# Patient Record
Sex: Female | Born: 1985 | Race: Black or African American | Hispanic: No | Marital: Single | Smoking: Never smoker
Health system: Southern US, Community
[De-identification: ages and names within clinical notes are randomized; demographics above are authoritative.]

## PROBLEM LIST (undated history)

## (undated) ENCOUNTER — Inpatient Hospital Stay (HOSPITAL_COMMUNITY): Payer: Self-pay

## (undated) DIAGNOSIS — Z6841 Body Mass Index (BMI) 40.0 and over, adult: Secondary | ICD-10-CM

## (undated) DIAGNOSIS — O24419 Gestational diabetes mellitus in pregnancy, unspecified control: Secondary | ICD-10-CM

## (undated) DIAGNOSIS — O139 Gestational [pregnancy-induced] hypertension without significant proteinuria, unspecified trimester: Secondary | ICD-10-CM

## (undated) DIAGNOSIS — B009 Herpesviral infection, unspecified: Secondary | ICD-10-CM

## (undated) HISTORY — PX: TONSILLECTOMY: SUR1361

## (undated) HISTORY — DX: Gestational (pregnancy-induced) hypertension without significant proteinuria, unspecified trimester: O13.9

---

## 2017-07-26 ENCOUNTER — Encounter: Payer: Self-pay | Admitting: Obstetrics and Gynecology

## 2017-07-26 ENCOUNTER — Ambulatory Visit (INDEPENDENT_AMBULATORY_CARE_PROVIDER_SITE_OTHER): Payer: BLUE CROSS/BLUE SHIELD | Admitting: Obstetrics and Gynecology

## 2017-07-26 VITALS — BP 141/74 | HR 53 | Ht 70.0 in | Wt 390.0 lb

## 2017-07-26 DIAGNOSIS — Z124 Encounter for screening for malignant neoplasm of cervix: Secondary | ICD-10-CM | POA: Diagnosis not present

## 2017-07-26 DIAGNOSIS — O0990 Supervision of high risk pregnancy, unspecified, unspecified trimester: Secondary | ICD-10-CM | POA: Insufficient documentation

## 2017-07-26 DIAGNOSIS — O99211 Obesity complicating pregnancy, first trimester: Secondary | ICD-10-CM

## 2017-07-26 DIAGNOSIS — Z113 Encounter for screening for infections with a predominantly sexual mode of transmission: Secondary | ICD-10-CM

## 2017-07-26 DIAGNOSIS — Z1151 Encounter for screening for human papillomavirus (HPV): Secondary | ICD-10-CM

## 2017-07-26 DIAGNOSIS — Z8759 Personal history of other complications of pregnancy, childbirth and the puerperium: Secondary | ICD-10-CM | POA: Insufficient documentation

## 2017-07-26 DIAGNOSIS — O0991 Supervision of high risk pregnancy, unspecified, first trimester: Secondary | ICD-10-CM

## 2017-07-26 DIAGNOSIS — Z6841 Body Mass Index (BMI) 40.0 and over, adult: Secondary | ICD-10-CM | POA: Insufficient documentation

## 2017-07-26 LAB — POCT URINALYSIS DIP (DEVICE)
BILIRUBIN URINE: NEGATIVE
GLUCOSE, UA: NEGATIVE mg/dL
KETONES UR: 40 mg/dL — AB
LEUKOCYTES UA: NEGATIVE
NITRITE: NEGATIVE
Protein, ur: NEGATIVE mg/dL
Specific Gravity, Urine: 1.025 (ref 1.005–1.030)
Urobilinogen, UA: 0.2 mg/dL (ref 0.0–1.0)
pH: 6.5 (ref 5.0–8.0)

## 2017-07-26 MED ORDER — PRENATAL VITAMINS 28-0.8 MG PO TABS: ORAL_TABLET | ORAL | 9 refills | Status: AC

## 2017-07-26 NOTE — Progress Notes (Signed)
Subjective:  Jamie IraniConstance Taylor is a 31 y.o. G2P1001 at 5512w3d being seen today for initial prenatal care.  She is currently monitored for the following issues for this high-risk pregnancy and has History of pre-eclampsia; Supervision of high risk pregnancy, antepartum; and Morbid obesity (HCC) on her problem list.   Patient reports pelvic cramping. She was seen in [place] and had US which showed intrauterine pregnancy per patient report. Cramping comes and goes. Had spotting in beginning but denies any now.  Contractions: Not present. Vag. Bleeding: None.  Movement: Absent. Denies leaking of fluid. Denies morning sickness. Taking PNV.  No h/o of cHTN per patient. Not on medications.   The following portions of the patient's history were reviewed and updated as appropriate: allergies, current medications, past family history, past medical history, past social history, past surgical history and problem list. Problem list updated.  Objective:  BP (!) 141/74   Pulse (!) 53   Ht 5\' 10"  (1.778 m)   Wt (!) 390 lb (176.9 kg)   BMI 55.96 kg/m   Fetal Status:     Movement: Absent     General:  Alert, oriented and cooperative. Patient is in no acute distress. Obese.  Skin: Skin is warm and dry. No rash noted.   Cardiovascular: Normal heart rate noted  Respiratory: Normal respiratory effort, no problems with respiration noted  Abdomen: Soft, protuberant, appropriate for gestational age. Pain/Pressure: Present     Pelvic: Vag. Bleeding: None   Genitalia:  Normal introitus for age, no external lesions, no vaginal discharge, mucosa pink and moist, no vaginal or cervical lesions, no vaginal atrophy, no friaility or hemorrhage, normal uterus size and position, no adnexal masses or tenderness  Extremities: Normal range of motion.  Edema: None  Mental Status: Normal mood and affect. Normal behavior. Normal judgment and thought content.   Urinalysis: Urine dipstick shows negative for all components,  positive for ketones.  Assessment and Plan:  Pregnancy: G2P1001 at 6412w3d  1. Supervision of high risk pregnancy, antepartum Continue routine prenatal care. Patient high-risk due to h/o pre-eclampsia in prior pregnancy and morbid obesity. Labs per orders. - Cytology - PAP - Obstetric Panel, Including HIV - Culture, OB Urine  2. History of pre-eclampsia BP elevated today x1. Will not diagnosis with cHTN at this time but most likely has this. Start ASA at 12wks. Will collect initial pre-E labs.  - Comprehensive metabolic panel - Protein / creatinine ratio, urine  3. Morbid obesity (HCC) No h/o diabetes. Collect A1c. Discussed appropriate weight gain in pregnancy. Letter written for patient to continue exercise during pregnancy.  - Hemoglobin A1c  Counseled on general obstetric precautions including but not limited to vaginal bleeding, cramping, etc.  Please refer to After Visit Summary for other counseling recommendations.  Return in about 4 weeks (around 08/23/2017) for ob visit.   Caryl AdaJazma Phelps, DO OB Fellow Faculty Practice, [facility] 07/26/2017, 8:56 AM

## 2017-07-26 NOTE — Progress Notes (Signed)
Pt reports that she had an ultrasound last week at the emergency room in [place]. Will send ROI to get records.

## 2017-07-26 NOTE — Patient Instructions (Addendum)
Will start ASA at 12 weeks Follow-up in 4 weeks  First Trimester of Pregnancy The first trimester of pregnancy is from week 1 until the end of week 13 (months 1 through 3). During this time, your baby will begin to develop inside you. At 6-8 weeks, the eyes and face are formed, and the heartbeat can be seen on ultrasound. At the end of 12 weeks, all the baby's organs are formed. Prenatal care is all the medical care you receive before the birth of your baby. Make sure you get good prenatal care and follow all of your doctor's instructions. Follow these instructions at home: Medicines  Take over-the-counter and prescription medicines only as told by your doctor. Some medicines are safe and some medicines are not safe during pregnancy.  Take a prenatal vitamin that contains at least 600 micrograms (mcg) of folic acid.  If you have trouble pooping (constipation), take medicine that will make your stool soft (stool softener) if your doctor approves. Eating and drinking  Eat regular, healthy meals.  Your doctor will tell you the amount of weight gain that is right for you.  Avoid raw meat and uncooked cheese.  If you feel sick to your stomach (nauseous) or throw up (vomit): ? Eat 4 or 5 small meals a day instead of 3 large meals. ? Try eating a few soda crackers. ? Drink liquids between meals instead of during meals.  To prevent constipation: ? Eat foods that are high in fiber, like fresh fruits and vegetables, whole grains, and beans. ? Drink enough fluids to keep your pee (urine) clear or pale yellow. Activity  Exercise only as told by your doctor. Stop exercising if you have cramps or pain in your lower belly (abdomen) or low back.  Do not exercise if it is too hot, too humid, or if you are in a place of great height (high altitude).  Try to avoid standing for long periods of time. Move your legs often if you must stand in one place for a long time.  Avoid heavy lifting.  Wear  low-heeled shoes. Sit and stand up straight.  You can have sex unless your doctor tells you not to. Relieving pain and discomfort  Wear a good support bra if your breasts are sore.  Take warm water baths (sitz baths) to soothe pain or discomfort caused by hemorrhoids. Use hemorrhoid cream if your doctor says it is okay.  Rest with your legs raised if you have leg cramps or low back pain.  If you have puffy, bulging veins (varicose veins) in your legs: ? Wear support hose or compression stockings as told by your doctor. ? Raise (elevate) your feet for 15 minutes, 3-4 times a day. ? Limit salt in your food. Prenatal care  Schedule your prenatal visits by the twelfth week of pregnancy.  Write down your questions. Take them to your prenatal visits.  Keep all your prenatal visits as told by your doctor. This is important. Safety  Wear your seat belt at all times when driving.  Make a list of emergency phone numbers. The list should include numbers for family, friends, the hospital, and police and fire departments. General instructions  Ask your doctor for a referral to a local prenatal class. Begin classes no later than at the start of month 6 of your pregnancy.  Ask for help if you need counseling or if you need help with nutrition. Your doctor can give you advice or tell you where to go  for help.  Do not use hot tubs, steam rooms, or saunas.  Do not douche or use tampons or scented sanitary pads.  Do not cross your legs for long periods of time.  Avoid all herbs and alcohol. Avoid drugs that are not approved by your doctor.  Do not use any tobacco products, including cigarettes, chewing tobacco, and electronic cigarettes. If you need help quitting, ask your doctor. You may get counseling or other support to help you quit.  Avoid cat litter boxes and soil used by cats. These carry germs that can cause birth defects in the baby and can cause a loss of your baby (miscarriage) or  stillbirth.  Visit your dentist. At home, brush your teeth with a soft toothbrush. Be gentle when you floss. Contact a doctor if:  You are dizzy.  You have mild cramps or pressure in your lower belly.  You have a nagging pain in your belly area.  You continue to feel sick to your stomach, you throw up, or you have watery poop (diarrhea).  You have a bad smelling fluid coming from your vagina.  You have pain when you pee (urinate).  You have increased puffiness (swelling) in your face, hands, legs, or ankles. Get help right away if:  You have a fever.  You are leaking fluid from your vagina.  You have spotting or bleeding from your vagina.  You have very bad belly cramping or pain.  You gain or lose weight rapidly.  You throw up blood. It may look like coffee grounds.  You are around people who have [place] measles, fifth disease, or chickenpox.  You have a very bad headache.  You have shortness of breath.  You have any kind of trauma, such as from a fall or a car accident. Summary  The first trimester of pregnancy is from week 1 until the end of week 13 (months 1 through 3).  To take care of yourself and your unborn baby, you will need to eat healthy meals, take medicines only if your doctor tells you to do so, and do activities that are safe for you and your baby.  Keep all follow-up visits as told by your doctor. This is important as your doctor will have to ensure that your baby is healthy and growing well. This information is not intended to replace advice given to you by your health care provider. Make sure you discuss any questions you have with your health care provider. Document Released: 05/30/2008 Document Revised: 12/20/2016 Document Reviewed: 12/20/2016 Elsevier Interactive Patient Education  2017 [employer]

## 2017-07-27 LAB — OBSTETRIC PANEL, INCLUDING HIV
Antibody Screen: NEGATIVE
BASOS ABS: 0 10*3/uL (ref 0.0–0.2)
Basos: 0 %
EOS (ABSOLUTE): 0 10*3/uL (ref 0.0–0.4)
Eos: 1 %
HEMATOCRIT: 36.1 % (ref 34.0–46.6)
HEP B S AG: NEGATIVE
HIV SCREEN 4TH GENERATION: NONREACTIVE
Hemoglobin: 12.5 g/dL (ref 11.1–15.9)
IMMATURE GRANS (ABS): 0 10*3/uL (ref 0.0–0.1)
Immature Granulocytes: 0 %
LYMPHS: 17 %
Lymphocytes Absolute: 1.5 10*3/uL (ref 0.7–3.1)
MCH: 30.3 pg (ref 26.6–33.0)
MCHC: 34.6 g/dL (ref 31.5–35.7)
MCV: 87 fL (ref 79–97)
MONOCYTES: 7 %
Monocytes Absolute: 0.6 10*3/uL (ref 0.1–0.9)
NEUTROS ABS: 6.5 10*3/uL (ref 1.4–7.0)
Neutrophils: 75 %
PLATELETS: 292 10*3/uL (ref 150–379)
RBC: 4.13 x10E6/uL (ref 3.77–5.28)
RDW: 14.5 % (ref 12.3–15.4)
RPR: NONREACTIVE
RUBELLA: 2.27 {index} (ref >0.99)
Rh Factor: POSITIVE
WBC: 8.6 10*3/uL (ref 3.4–10.8)

## 2017-07-27 LAB — COMPREHENSIVE METABOLIC PANEL
ALBUMIN: 3.7 g/dL (ref 3.5–5.5)
ALT: 12 IU/L (ref 0–32)
AST: 15 IU/L (ref 0–40)
Albumin/Globulin Ratio: 1.4 (ref 1.2–2.2)
Alkaline Phosphatase: 59 IU/L (ref 39–117)
BUN / CREAT RATIO: 15 (ref 9–23)
BUN: 10 mg/dL (ref 6–20)
Bilirubin Total: 0.7 mg/dL (ref 0.0–1.2)
CALCIUM: 8.9 mg/dL (ref 8.7–10.2)
CO2: 18 mmol/L — AB (ref 20–29)
CREATININE: 0.68 mg/dL (ref 0.57–1.00)
Chloride: 102 mmol/L (ref 96–106)
GFR, EST AFRICAN AMERICAN: 134 mL/min/{1.73_m2} (ref >59)
GFR, EST NON AFRICAN AMERICAN: 116 mL/min/{1.73_m2} (ref >59)
GLOBULIN, TOTAL: 2.7 g/dL (ref 1.5–4.5)
GLUCOSE: 88 mg/dL (ref 65–99)
Potassium: 4.1 mmol/L (ref 3.5–5.2)
SODIUM: 135 mmol/L (ref 134–144)
TOTAL PROTEIN: 6.4 g/dL (ref 6.0–8.5)

## 2017-07-27 LAB — PROTEIN / CREATININE RATIO, URINE
CREATININE, UR: 197.5 mg/dL
Protein, Ur: 10.5 mg/dL
Protein/Creat Ratio: 53 mg/g creat (ref 0–200)

## 2017-07-27 LAB — HEMOGLOBIN A1C
ESTIMATED AVERAGE GLUCOSE: 111 mg/dL
HEMOGLOBIN A1C: 5.5 % (ref 4.8–5.6)

## 2017-07-28 LAB — CYTOLOGY - PAP
Chlamydia: NEGATIVE
Diagnosis: NEGATIVE
HPV (WINDOPATH): NOT DETECTED
NEISSERIA GONORRHEA: NEGATIVE

## 2017-07-28 LAB — URINE CULTURE, OB REFLEX

## 2017-07-28 LAB — CULTURE, OB URINE

## 2017-08-01 ENCOUNTER — Ambulatory Visit: Payer: Self-pay

## 2017-08-02 ENCOUNTER — Encounter: Payer: Self-pay | Admitting: Obstetrics and Gynecology

## 2017-08-04 ENCOUNTER — Encounter: Payer: Self-pay | Admitting: Obstetrics and Gynecology

## 2017-08-10 ENCOUNTER — Encounter: Payer: Self-pay | Admitting: Obstetrics and Gynecology

## 2017-08-21 ENCOUNTER — Inpatient Hospital Stay (HOSPITAL_COMMUNITY): Payer: BLUE CROSS/BLUE SHIELD

## 2017-08-21 ENCOUNTER — Inpatient Hospital Stay (HOSPITAL_COMMUNITY)
Admission: AD | Admit: 2017-08-21 | Discharge: 2017-08-21 | Disposition: A | Discharge status: Home / Self Care | Payer: BLUE CROSS/BLUE SHIELD | Source: Ambulatory Visit | Attending: Obstetrics & Gynecology | Admitting: Obstetrics & Gynecology

## 2017-08-21 ENCOUNTER — Encounter (HOSPITAL_COMMUNITY): Payer: Self-pay

## 2017-08-21 DIAGNOSIS — O0992 Supervision of high risk pregnancy, unspecified, second trimester: Secondary | ICD-10-CM | POA: Diagnosis not present

## 2017-08-21 DIAGNOSIS — O0990 Supervision of high risk pregnancy, unspecified, unspecified trimester: Secondary | ICD-10-CM

## 2017-08-21 DIAGNOSIS — Z3A22 22 weeks gestation of pregnancy: Secondary | ICD-10-CM | POA: Diagnosis not present

## 2017-08-21 DIAGNOSIS — O23592 Infection of other part of genital tract in pregnancy, second trimester: Secondary | ICD-10-CM | POA: Insufficient documentation

## 2017-08-21 DIAGNOSIS — Z8759 Personal history of other complications of pregnancy, childbirth and the puerperium: Secondary | ICD-10-CM

## 2017-08-21 DIAGNOSIS — O208 Other hemorrhage in early pregnancy: Secondary | ICD-10-CM | POA: Diagnosis not present

## 2017-08-21 DIAGNOSIS — N760 Acute vaginitis: Secondary | ICD-10-CM

## 2017-08-21 DIAGNOSIS — O0993 Supervision of high risk pregnancy, unspecified, third trimester: Secondary | ICD-10-CM

## 2017-08-21 DIAGNOSIS — B9689 Other specified bacterial agents as the cause of diseases classified elsewhere: Secondary | ICD-10-CM

## 2017-08-21 DIAGNOSIS — O209 Hemorrhage in early pregnancy, unspecified: Secondary | ICD-10-CM

## 2017-08-21 DIAGNOSIS — O26892 Other specified pregnancy related conditions, second trimester: Secondary | ICD-10-CM | POA: Diagnosis present

## 2017-08-21 DIAGNOSIS — R109 Unspecified abdominal pain: Secondary | ICD-10-CM | POA: Diagnosis present

## 2017-08-21 LAB — WET PREP, GENITAL
SPERM: NONE SEEN
Trich, Wet Prep: NONE SEEN
YEAST WET PREP: NONE SEEN

## 2017-08-21 LAB — URINALYSIS, ROUTINE W REFLEX MICROSCOPIC
BILIRUBIN URINE: NEGATIVE
GLUCOSE, UA: NEGATIVE mg/dL
Ketones, ur: NEGATIVE mg/dL
LEUKOCYTES UA: NEGATIVE
NITRITE: NEGATIVE
Protein, ur: NEGATIVE mg/dL
SPECIFIC GRAVITY, URINE: 1.017 (ref 1.005–1.030)
pH: 6 (ref 5.0–8.0)

## 2017-08-21 MED ORDER — METRONIDAZOLE 500 MG PO TABS: 500.0000 mg | ORAL_TABLET | Freq: Three times a day (TID) | ORAL | 0 refills | Status: DC

## 2017-08-21 NOTE — Discharge Instructions (Signed)

## 2017-08-21 NOTE — MAU Provider Note (Signed)
Jamie Taylor 31 y.o. G2P1001 Patient complains of vaginal bleeding with abdominal pain beginning last night at 1900. History     CSN: [id number]  Arrival date and time: 08/21/17 4098   First Provider Initiated Contact with Patient 08/21/17 [phone]      Chief Complaint  Patient presents with  . Vaginal Bleeding  . Abdominal Pain   Vaginal Bleeding  The patient's primary symptoms include vaginal bleeding. This is a new problem. The current episode started yesterday. The problem occurs intermittently. The problem has been gradually worsening. The pain is moderate. She is pregnant. Associated symptoms include abdominal pain. Pertinent negatives include no headaches, nausea or vomiting. The vaginal discharge was bloody. The vaginal bleeding is spotting. She has not been passing clots. She is sexually active.  Abdominal Pain  This is a new problem. The current episode started yesterday. The onset quality is sudden. The problem occurs constantly. The problem has been waxing and waning. The pain is located in the periumbilical region. The pain is severe. The quality of the pain is cramping. Pertinent negatives include no headaches, nausea or vomiting.  The patient states that the vaginal bleeding was initially dark brown in color but has since been bright red. She also noted a larger amount this AM than there was last night when it began. The patient last had sexual intercourse yesterday morning. Patient first noticed the bleeding on her pantyliner at 1900 when she went to the bathroom. Then she started monitoring it over night and saw a few more spots and clots.  OB History    Gravida Para Term Preterm AB Living   2 1 1     1    SAB TAB Ectopic Multiple Live Births                  Past Medical History:  Diagnosis Date  . Pregnancy induced hypertension     Past Surgical History:  Procedure Laterality Date  . TONSILLECTOMY      History reviewed. No pertinent family history.  Social  History  Substance Use Topics  . Smoking status: Never Smoker  . Smokeless tobacco: Never Used  . Alcohol use No    Allergies: No Known Allergies  Prescriptions Prior to Admission  Medication Sig Dispense Refill Last Dose  . Prenatal Vit-Fe Fumarate-FA (PRENATAL VITAMINS) 28-0.8 MG TABS Take one tablet daily. 30 tablet 9     Review of Systems  Constitutional: Negative for fatigue.  Eyes: Negative for visual disturbance.  Respiratory: Negative for shortness of breath.   Cardiovascular: Negative for chest pain.  Gastrointestinal: Positive for abdominal pain. Negative for nausea and vomiting.  Genitourinary: Positive for vaginal bleeding.  Neurological: Negative for dizziness and headaches.   Physical Exam   Blood pressure (!) 142/50, pulse 71, temperature 98 F (36.7 C), temperature source Oral, resp. rate 18.  Physical Exam  Constitutional: She appears well-developed and well-nourished.  Cardiovascular: Normal rate and regular rhythm.   Respiratory: Effort normal. No respiratory distress.  Genitourinary: Cervix exhibits discharge. There is bleeding in the vagina. Vaginal discharge found.  Genitourinary Comments: NEFG; trace amount of blood in the vagina. No CMT, adnexal or suprapubic tenderness.     MAU Course  Procedures  MDM KOH wet prep Ultasound: SIUP with cardiac activity.  Advised patient that she may be feeling some early discomforts of pregnancy and that some women spot throughout their pregnancy and we don't know why.   Assessment and Plan  Assessment: 1. KOH prep shows clue  cells and WBCs 2. US shows SIUP with cardiac activity at 176 bpm  Plan: 1. Flagyl 500mg  TID x7 days 2. Follow up appointment as scheduled 3. If symptoms continue or worsen then return to MAU. Anna Genre 08/21/2017, 9:52 AM   I confirm that I have verified the information documented in the student's note and that I have also personally performed the physical exam and all medical  decision making activities.    1. Bacterial vaginosis   2. History of pre-eclampsia   3. Supervision of high risk pregnancy, antepartum   4. Vaginal bleeding affecting early pregnancy   5. Vaginal bleeding before [redacted] weeks gestation    2. Patient stable for discharge with RX for Flagyl and recommendations to keep prenatal visit.  3. Reviewed bleeding precautions and when to return to the MAU. Patient verbalized understanding.   Luna Kitchens CNM

## 2017-08-21 NOTE — MAU Note (Signed)
Pt has been having cramping and spotting. The spotting is getting a little worse today.

## 2017-08-22 LAB — GC/CHLAMYDIA PROBE AMP (~~LOC~~) NOT AT ARMC
Chlamydia: NEGATIVE
NEISSERIA GONORRHEA: NEGATIVE

## 2017-08-24 ENCOUNTER — Ambulatory Visit (INDEPENDENT_AMBULATORY_CARE_PROVIDER_SITE_OTHER): Payer: BLUE CROSS/BLUE SHIELD | Admitting: Family Medicine

## 2017-08-24 VITALS — BP 138/50 | HR 70 | Wt 393.5 lb

## 2017-08-24 DIAGNOSIS — O09292 Supervision of pregnancy with other poor reproductive or obstetric history, second trimester: Secondary | ICD-10-CM

## 2017-08-24 DIAGNOSIS — O0990 Supervision of high risk pregnancy, unspecified, unspecified trimester: Secondary | ICD-10-CM

## 2017-08-24 DIAGNOSIS — O0992 Supervision of high risk pregnancy, unspecified, second trimester: Secondary | ICD-10-CM

## 2017-08-24 DIAGNOSIS — O219 Vomiting of pregnancy, unspecified: Secondary | ICD-10-CM

## 2017-08-24 DIAGNOSIS — A6000 Herpesviral infection of urogenital system, unspecified: Secondary | ICD-10-CM | POA: Insufficient documentation

## 2017-08-24 DIAGNOSIS — A6004 Herpesviral vulvovaginitis: Secondary | ICD-10-CM

## 2017-08-24 DIAGNOSIS — O09299 Supervision of pregnancy with other poor reproductive or obstetric history, unspecified trimester: Secondary | ICD-10-CM

## 2017-08-24 DIAGNOSIS — Z8759 Personal history of other complications of pregnancy, childbirth and the puerperium: Secondary | ICD-10-CM

## 2017-08-24 MED ORDER — ASPIRIN EC 81 MG PO TBEC: 81.0000 mg | DELAYED_RELEASE_TABLET | Freq: Every day | ORAL | 3 refills | Status: DC

## 2017-08-24 MED ORDER — DOXYLAMINE-PYRIDOXINE 10-10 MG PO TBEC: 1.0000 | DELAYED_RELEASE_TABLET | Freq: Four times a day (QID) | ORAL | 2 refills | Status: DC

## 2017-08-24 NOTE — Patient Instructions (Signed)
 Second Trimester of Pregnancy The second trimester is from week 14 through week 27 (months 4 through 6). The second trimester is often a time when you feel your best. Your body has adjusted to being pregnant, and you begin to feel better physically. Usually, morning sickness has lessened or quit completely, you may have more energy, and you may have an increase in appetite. The second trimester is also a time when the fetus is growing rapidly. At the end of the sixth month, the fetus is about 9 inches long and weighs about 1 pounds. You will likely begin to feel the baby move (quickening) between 16 and 20 weeks of pregnancy. Body changes during your second trimester Your body continues to go through many changes during your second trimester. The changes vary from woman to woman.  Your weight will continue to increase. You will notice your lower abdomen bulging out.  You may begin to get stretch marks on your hips, abdomen, and breasts.  You may develop headaches that can be relieved by medicines. The medicines should be approved by your health care provider.  You may urinate more often because the fetus is pressing on your bladder.  You may develop or continue to have heartburn as a result of your pregnancy.  You may develop constipation because certain hormones are causing the muscles that push waste through your intestines to slow down.  You may develop hemorrhoids or swollen, bulging veins (varicose veins).  You may have back pain. This is caused by: ? Weight gain. ? Pregnancy hormones that are relaxing the joints in your pelvis. ? A shift in weight and the muscles that support your balance.  Your breasts will continue to grow and they will continue to become tender.  Your gums may bleed and may be sensitive to brushing and flossing.  Dark spots or blotches (chloasma, mask of pregnancy) may develop on your face. This will likely fade after the baby is born.  A dark line from  your belly button to the pubic area (linea nigra) may appear. This will likely fade after the baby is born.  You may have changes in your hair. These can include thickening of your hair, rapid growth, and changes in texture. Some women also have hair loss during or after pregnancy, or hair that feels dry or thin. Your hair will most likely return to normal after your baby is born.  What to expect at prenatal visits During a routine prenatal visit:  You will be weighed to make sure you and the fetus are growing normally.  Your blood pressure will be taken.  Your abdomen will be measured to track your baby's growth.  The fetal heartbeat will be listened to.  Any test results from the previous visit will be discussed.  Your health care provider may ask you:  How you are feeling.  If you are feeling the baby move.  If you have had any abnormal symptoms, such as leaking fluid, bleeding, severe headaches, or abdominal cramping.  If you are using any tobacco products, including cigarettes, chewing tobacco, and electronic cigarettes.  If you have any questions.  Other tests that may be performed during your second trimester include:  Blood tests that check for: ? Low iron levels (anemia). ? High blood sugar that affects pregnant women (gestational diabetes) between 24 and 28 weeks. ? Rh antibodies. This is to check for a protein on red blood cells (Rh factor).  Urine tests to check for infections, diabetes,   or protein in the urine.  An ultrasound to confirm the proper growth and development of the baby.  An amniocentesis to check for possible genetic problems.  Fetal screens for spina bifida and Down syndrome.  HIV (human immunodeficiency virus) testing. Routine prenatal testing includes screening for HIV, unless you choose not to have this test.  Follow these instructions at home: Medicines  Follow your health care provider's instructions regarding medicine use. Specific  medicines may be either safe or unsafe to take during pregnancy.  Take a prenatal vitamin that contains at least 600 micrograms (mcg) of folic acid.  If you develop constipation, try taking a stool softener if your health care provider approves. Eating and drinking  Eat a balanced diet that includes fresh fruits and vegetables, whole grains, good sources of protein such as meat, eggs, or tofu, and low-fat dairy. Your health care provider will help you determine the amount of weight gain that is right for you.  Avoid raw meat and uncooked cheese. These carry germs that can cause birth defects in the baby.  If you have low calcium intake from food, talk to your health care provider about whether you should take a daily calcium supplement.  Limit foods that are high in fat and processed sugars, such as fried and sweet foods.  To prevent constipation: ? Drink enough fluid to keep your urine clear or pale yellow. ? Eat foods that are high in fiber, such as fresh fruits and vegetables, whole grains, and beans. Activity  Exercise only as directed by your health care provider. Most women can continue their usual exercise routine during pregnancy. Try to exercise for 30 minutes at least 5 days a week. Stop exercising if you experience uterine contractions.  Avoid heavy lifting, wear low heel shoes, and practice good posture.  A sexual relationship may be continued unless your health care provider directs you otherwise. Relieving pain and discomfort  Wear a good support bra to prevent discomfort from breast tenderness.  Take warm sitz baths to soothe any pain or discomfort caused by hemorrhoids. Use hemorrhoid cream if your health care provider approves.  Rest with your legs elevated if you have leg cramps or low back pain.  If you develop varicose veins, wear support hose. Elevate your feet for 15 minutes, 3-4 times a day. Limit salt in your diet. Prenatal Care  Write down your questions.  Take them to your prenatal visits.  Keep all your prenatal visits as told by your health care provider. This is important. Safety  Wear your seat belt at all times when driving.  Make a list of emergency phone numbers, including numbers for family, friends, the hospital, and police and fire departments. General instructions  Ask your health care provider for a referral to a local prenatal education class. Begin classes no later than the beginning of month 6 of your pregnancy.  Ask for help if you have counseling or nutritional needs during pregnancy. Your health care provider can offer advice or refer you to specialists for help with various needs.  Do not use hot tubs, steam rooms, or saunas.  Do not douche or use tampons or scented sanitary pads.  Do not cross your legs for long periods of time.  Avoid cat litter boxes and soil used by cats. These carry germs that can cause birth defects in the baby and possibly loss of the fetus by miscarriage or stillbirth.  Avoid all smoking, herbs, alcohol, and unprescribed drugs. Chemicals in these products   can affect the formation and growth of the baby.  Do not use any products that contain nicotine or tobacco, such as cigarettes and e-cigarettes. If you need help quitting, ask your health care provider.  Visit your dentist if you have not gone yet during your pregnancy. Use a soft toothbrush to brush your teeth and be gentle when you floss. Contact a health care provider if:  You have dizziness.  You have mild pelvic cramps, pelvic pressure, or nagging pain in the abdominal area.  You have persistent nausea, vomiting, or diarrhea.  You have a bad smelling vaginal discharge.  You have pain when you urinate. Get help right away if:  You have a fever.  You are leaking fluid from your vagina.  You have spotting or bleeding from your vagina.  You have severe abdominal cramping or pain.  You have rapid weight gain or weight  loss.  You have shortness of breath with chest pain.  You notice sudden or extreme swelling of your face, hands, ankles, feet, or legs.  You have not felt your baby move in over an hour.  You have severe headaches that do not go away when you take medicine.  You have vision changes. Summary  The second trimester is from week 14 through week 27 (months 4 through 6). It is also a time when the fetus is growing rapidly.  Your body goes through many changes during pregnancy. The changes vary from woman to woman.  Avoid all smoking, herbs, alcohol, and unprescribed drugs. These chemicals affect the formation and growth your baby.  Do not use any tobacco products, such as cigarettes, chewing tobacco, and e-cigarettes. If you need help quitting, ask your health care provider.  Contact your health care provider if you have any questions. Keep all prenatal visits as told by your health care provider. This is important. This information is not intended to replace advice given to you by your health care provider. Make sure you discuss any questions you have with your health care provider. Document Released: 12/06/2001 Document Revised: 05/19/2016 Document Reviewed: 02/12/2013 [employer] Interactive Patient Education  2017 [employer]   Breastfeeding Deciding to breastfeed is one of the best choices you can make for you and your baby. A change in hormones during pregnancy causes your breast tissue to grow and increases the number and size of your milk ducts. These hormones also allow proteins, sugars, and fats from your blood supply to make breast milk in your milk-producing glands. Hormones prevent breast milk from being released before your baby is born as well as prompt milk flow after birth. Once breastfeeding has begun, thoughts of your baby, as well as his or her sucking or crying, can stimulate the release of milk from your milk-producing glands. Benefits of breastfeeding For Your  Baby  Your first milk (colostrum) helps your baby's digestive system function better.  There are antibodies in your milk that help your baby fight off infections.  Your baby has a lower incidence of asthma, allergies, and sudden infant death syndrome.  The nutrients in breast milk are better for your baby than infant formulas and are designed uniquely for your baby's needs.  Breast milk improves your baby's brain development.  Your baby is less likely to develop other conditions, such as childhood obesity, asthma, or type 2 diabetes mellitus.  For You  Breastfeeding helps to create a very special bond between you and your baby.  Breastfeeding is convenient. Breast milk is always available at   the correct temperature and costs nothing.  Breastfeeding helps to burn calories and helps you lose the weight gained during pregnancy.  Breastfeeding makes your uterus contract to its prepregnancy size faster and slows bleeding (lochia) after you give birth.  Breastfeeding helps to lower your risk of developing type 2 diabetes mellitus, osteoporosis, and breast or ovarian cancer later in life.  Signs that your baby is hungry Early Signs of Hunger  Increased alertness or activity.  Stretching.  Movement of the head from side to side.  Movement of the head and opening of the mouth when the corner of the mouth or cheek is stroked (rooting).  Increased sucking sounds, smacking lips, cooing, sighing, or squeaking.  Hand-to-mouth movements.  Increased sucking of fingers or hands.  Late Signs of Hunger  Fussing.  Intermittent crying.  Extreme Signs of Hunger Signs of extreme hunger will require calming and consoling before your baby will be able to breastfeed successfully. Do not wait for the following signs of extreme hunger to occur before you initiate breastfeeding:  Restlessness.  A loud, strong cry.  Screaming.  Breastfeeding basics Breastfeeding Initiation  Find a  comfortable place to sit or lie down, with your neck and back well supported.  Place a pillow or rolled up blanket under your baby to bring him or her to the level of your breast (if you are seated). Nursing pillows are specially designed to help support your arms and your baby while you breastfeed.  Make sure that your baby's abdomen is facing your abdomen.  Gently massage your breast. With your fingertips, massage from your chest wall toward your nipple in a circular motion. This encourages milk flow. You may need to continue this action during the feeding if your milk flows slowly.  Support your breast with 4 fingers underneath and your thumb above your nipple. Make sure your fingers are well away from your nipple and your baby's mouth.  Stroke your baby's lips gently with your finger or nipple.  When your baby's mouth is open wide enough, quickly bring your baby to your breast, placing your entire nipple and as much of the colored area around your nipple (areola) as possible into your baby's mouth. ? More areola should be visible above your baby's upper lip than below the lower lip. ? Your baby's tongue should be between his or her lower gum and your breast.  Ensure that your baby's mouth is correctly positioned around your nipple (latched). Your baby's lips should create a seal on your breast and be turned out (everted).  It is common for your baby to suck about 2-3 minutes in order to start the flow of breast milk.  Latching Teaching your baby how to latch on to your breast properly is very important. An improper latch can cause nipple pain and decreased milk supply for you and poor weight gain in your baby. Also, if your baby is not latched onto your nipple properly, he or she may swallow some air during feeding. This can make your baby fussy. Burping your baby when you switch breasts during the feeding can help to get rid of the air. However, teaching your baby to latch on properly is  still the best way to prevent fussiness from swallowing air while breastfeeding. Signs that your baby has successfully latched on to your nipple:  Silent tugging or silent sucking, without causing you pain.  Swallowing heard between every 3-4 sucks.  Muscle movement above and in front of his or her   ears while sucking.  Signs that your baby has not successfully latched on to nipple:  Sucking sounds or smacking sounds from your baby while breastfeeding.  Nipple pain.  If you think your baby has not latched on correctly, slip your finger into the corner of your baby's mouth to break the suction and place it between your baby's gums. Attempt breastfeeding initiation again. Signs of Successful Breastfeeding Signs from your baby:  A gradual decrease in the number of sucks or complete cessation of sucking.  Falling asleep.  Relaxation of his or her body.  Retention of a small amount of milk in his or her mouth.  Letting go of your breast by himself or herself.  Signs from you:  Breasts that have increased in firmness, weight, and size 1-3 hours after feeding.  Breasts that are softer immediately after breastfeeding.  Increased milk volume, as well as a change in milk consistency and color by the fifth day of breastfeeding.  Nipples that are not sore, cracked, or bleeding.  Signs That Your Baby is Getting Enough Milk  Wetting at least 1-2 diapers during the first 24 hours after birth.  Wetting at least 5-6 diapers every 24 hours for the first week after birth. The urine should be clear or pale yellow by 5 days after birth.  Wetting 6-8 diapers every 24 hours as your baby continues to grow and develop.  At least 3 stools in a 24-hour period by age 5 days. The stool should be soft and yellow.  At least 3 stools in a 24-hour period by age 7 days. The stool should be seedy and yellow.  No loss of weight greater than 10% of birth weight during the first 3 days of age.  Average  weight gain of 4-7 ounces (113-198 g) per week after age 4 days.  Consistent daily weight gain by age 5 days, without weight loss after the age of 2 weeks.  After a feeding, your baby may spit up a small amount. This is common. Breastfeeding frequency and duration Frequent feeding will help you make more milk and can prevent sore nipples and breast engorgement. Breastfeed when you feel the need to reduce the fullness of your breasts or when your baby shows signs of hunger. This is called "breastfeeding on demand." Avoid introducing a pacifier to your baby while you are working to establish breastfeeding (the first 4-6 weeks after your baby is born). After this time you may choose to use a pacifier. Research has shown that pacifier use during the first year of a baby's life decreases the risk of sudden infant death syndrome (SIDS). Allow your baby to feed on each breast as long as he or she wants. Breastfeed until your baby is finished feeding. When your baby unlatches or falls asleep while feeding from the first breast, offer the second breast. Because newborns are often sleepy in the first few weeks of life, you may need to awaken your baby to get him or her to feed. Breastfeeding times will vary from baby to baby. However, the following rules can serve as a guide to help you ensure that your baby is properly fed:  Newborns (babies 4 weeks of age or younger) may breastfeed every 1-3 hours.  Newborns should not go longer than 3 hours during the day or 5 hours during the night without breastfeeding.  You should breastfeed your baby a minimum of 8 times in a 24-hour period until you begin to introduce solid foods to your   baby at around 6 months of age.  Breast milk pumping Pumping and storing breast milk allows you to ensure that your baby is exclusively fed your breast milk, even at times when you are unable to breastfeed. This is especially important if you are going back to work while you are still  breastfeeding or when you are not able to be present during feedings. Your lactation consultant can give you guidelines on how long it is safe to store breast milk. A breast pump is a machine that allows you to pump milk from your breast into a sterile bottle. The pumped breast milk can then be stored in a refrigerator or freezer. Some breast pumps are operated by hand, while others use electricity. Ask your lactation consultant which type will work best for you. Breast pumps can be purchased, but some hospitals and breastfeeding support groups lease breast pumps on a monthly basis. A lactation consultant can teach you how to hand express breast milk, if you prefer not to use a pump. Caring for your breasts while you breastfeed Nipples can become dry, cracked, and sore while breastfeeding. The following recommendations can help keep your breasts moisturized and healthy:  Avoid using soap on your nipples.  Wear a supportive bra. Although not required, special nursing bras and tank tops are designed to allow access to your breasts for breastfeeding without taking off your entire bra or top. Avoid wearing underwire-style bras or extremely tight bras.  Air dry your nipples for 3-4minutes after each feeding.  Use only cotton bra pads to absorb leaked breast milk. Leaking of breast milk between feedings is normal.  Use lanolin on your nipples after breastfeeding. Lanolin helps to maintain your skin's normal moisture barrier. If you use pure lanolin, you do not need to wash it off before feeding your baby again. Pure lanolin is not toxic to your baby. You may also hand express a few drops of breast milk and gently massage that milk into your nipples and allow the milk to air dry.  In the first few weeks after giving birth, some women experience extremely full breasts (engorgement). Engorgement can make your breasts feel heavy, warm, and tender to the touch. Engorgement peaks within 3-5 days after you give  birth. The following recommendations can help ease engorgement:  Completely empty your breasts while breastfeeding or pumping. You may want to start by applying warm, moist heat (in the shower or with warm water-soaked hand towels) just before feeding or pumping. This increases circulation and helps the milk flow. If your baby does not completely empty your breasts while breastfeeding, pump any extra milk after he or she is finished.  Wear a snug bra (nursing or regular) or tank top for 1-2 days to signal your body to slightly decrease milk production.  Apply ice packs to your breasts, unless this is too uncomfortable for you.  Make sure that your baby is latched on and positioned properly while breastfeeding.  If engorgement persists after 48 hours of following these recommendations, contact your health care provider or a lactation consultant. Overall health care recommendations while breastfeeding  Eat healthy foods. Alternate between meals and snacks, eating 3 of each per day. Because what you eat affects your breast milk, some of the foods may make your baby more irritable than usual. Avoid eating these foods if you are sure that they are negatively affecting your baby.  Drink milk, fruit juice, and water to satisfy your thirst (about 10 glasses a day).    Rest often, relax, and continue to take your prenatal vitamins to prevent fatigue, stress, and anemia.  Continue breast self-awareness checks.  Avoid chewing and smoking tobacco. Chemicals from cigarettes that pass into breast milk and exposure to secondhand smoke may harm your baby.  Avoid alcohol and drug use, including marijuana. Some medicines that may be harmful to your baby can pass through breast milk. It is important to ask your health care provider before taking any medicine, including all over-the-counter and prescription medicine as well as vitamin and herbal supplements. It is possible to become pregnant while breastfeeding.  If birth control is desired, ask your health care provider about options that will be safe for your baby. Contact a health care provider if:  You feel like you want to stop breastfeeding or have become frustrated with breastfeeding.  You have painful breasts or nipples.  Your nipples are cracked or bleeding.  Your breasts are red, tender, or warm.  You have a swollen area on either breast.  You have a fever or chills.  You have nausea or vomiting.  You have drainage other than breast milk from your nipples.  Your breasts do not become full before feedings by the fifth day after you give birth.  You feel sad and depressed.  Your baby is too sleepy to eat well.  Your baby is having trouble sleeping.  Your baby is wetting less than 3 diapers in a 24-hour period.  Your baby has less than 3 stools in a 24-hour period.  Your baby's skin or the white part of his or her eyes becomes yellow.  Your baby is not gaining weight by 5 days of age. Get help right away if:  Your baby is overly tired (lethargic) and does not want to wake up and feed.  Your baby develops an unexplained fever. This information is not intended to replace advice given to you by your health care provider. Make sure you discuss any questions you have with your health care provider. Document Released: 12/12/2005 Document Revised: 05/25/2016 Document Reviewed: 06/05/2013 [employer] Interactive Patient Education  2017 [employer]  

## 2017-08-24 NOTE — Progress Notes (Signed)
   PRENATAL VISIT NOTE  Subjective:  Jamie Taylor is a 31 y.o. G2P1001 at 6522w4d being seen today for ongoing prenatal care.  She is currently monitored for the following issues for this high-risk pregnancy and has History of pre-eclampsia; Supervision of high risk pregnancy, antepartum; Morbid obesity (HCC); and Genital herpes on her problem list.  Patient reports no complaints.  Contractions: Not present. Vag. Bleeding: None.  Movement: Absent. Denies leaking of fluid.   The following portions of the patient's history were reviewed and updated as appropriate: allergies, current medications, past family history, past medical history, past social history, past surgical history and problem list. Problem list updated.  Objective:   Vitals:   08/24/17 0805  BP: (!) 138/50  Pulse: 70  Weight: (!) 393 lb 8 oz (178.5 kg)    Fetal Status: Fetal Heart Rate (bpm): 154   Movement: Absent     General:  Alert, oriented and cooperative. Patient is in no acute distress.  Skin: Skin is warm and dry. No rash noted.   Cardiovascular: Normal heart rate noted  Respiratory: Normal respiratory effort, no problems with respiration noted  Abdomen: Soft, gravid, appropriate for gestational age.  Pain/Pressure: Absent     Pelvic: Cervical exam deferred        Extremities: Normal range of motion.  Edema: None  Mental Status:  Normal mood and affect. Normal behavior. Normal judgment and thought content.   Assessment and Plan:  Pregnancy: G2P1001 at 4322w4d  1. History of pre-eclampsia in prior pregnancy, currently pregnant Patient started on ASA  - aspirin EC 81 MG tablet; Take 1 tablet (81 mg total) by mouth daily.  Dispense: 90 tablet; Refill: 3  2. Morbid obesity (HCC) Normal initial testing  3. Nausea and vomiting of pregnancy, antepartum Trial of Dicligis - Doxylamine-Pyridoxine (DICLEGIS) 10-10 MG TBEC; Take 1 tablet by mouth 4 (four) times daily.  Dispense: 100 tablet; Refill: 2  4.  Supervision of high risk pregnancy, antepartum - US MFM OB DETAIL +14 WK; Future  6. Herpes simplex vulvovaginitis Will need suppression at 34 wks  General obstetric precautions including but not limited to vaginal bleeding, contractions, leaking of fluid and fetal movement were reviewed in detail with the patient. Please refer to After Visit Summary for other counseling recommendations.  Return in 4 weeks (on 09/21/2017).   Jamie Boresanya S Zohar Laing, MD

## 2017-08-25 ENCOUNTER — Other Ambulatory Visit: Payer: Self-pay | Admitting: Obstetrics & Gynecology

## 2017-08-25 MED ORDER — ONDANSETRON HCL 8 MG PO TABS: 8.0000 mg | ORAL_TABLET | Freq: Three times a day (TID) | ORAL | 2 refills | Status: DC | PRN

## 2017-08-25 NOTE — Progress Notes (Unsigned)
Pt can't get diclegis filled without authorization.  It is after hours and no RNs are available.  Pt is 13 weeks.  Zofran safe after 10 weeks.  RX sent to pharmacy.

## 2017-08-30 ENCOUNTER — Telehealth: Payer: Self-pay | Admitting: General Practice

## 2017-08-30 DIAGNOSIS — K5909 Other constipation: Secondary | ICD-10-CM

## 2017-08-30 MED ORDER — DOCUSATE SODIUM 100 MG PO CAPS: 100.0000 mg | ORAL_CAPSULE | Freq: Two times a day (BID) | ORAL | 0 refills | Status: DC | PRN

## 2017-08-30 NOTE — Telephone Encounter (Signed)
Patient called and left message stating she was recently prescribed several medications in the past few weeks for complications of her pregnancy. Patient states she is now constipated and wants to know what she can take. Called patient and informed her of Rx for stool softener sent to her pharmacy. Patient verbalized understanding and states she was told by her pharmacy that diclegis needs a prior auth and she has a limited supply of zofran. Told patient I will call for prior auth today and that processing usually takes 24-48 hours. Patient verbalized understanding & had no questions.   Called [employer] [facility] at [phone] for prior auth. One has already been initiated on 08/29/17 with reference number [id number] estimated turn around time is 14 days, but possibly less.

## 2017-09-05 ENCOUNTER — Encounter: Payer: Self-pay | Admitting: *Deleted

## 2017-09-21 ENCOUNTER — Telehealth: Payer: Self-pay | Admitting: *Deleted

## 2017-09-21 NOTE — Telephone Encounter (Signed)
Jamie Taylor called 09/20/17 am and left a message she is 4.5 months pregnant with morning sickness/ vomiting regularly.  States she has noticied some very small spots of blood and wanted to know if this is normal or of concern and requests a call back. Discussed with Donette Larry, CNM and may have Zantac 150 mg daily, if after 5 days no relief may increase to Zantac  BID. Also need to discuss with her if the zofran and Diclegis are helping her morning sickness, if not can stop those and start Reglan.   I called [name] and left a message I am returning her call and wanted to discuss her concerns and see if we can help- please call back to office.

## 2017-10-02 ENCOUNTER — Other Ambulatory Visit: Payer: Self-pay | Admitting: Family Medicine

## 2017-10-02 ENCOUNTER — Ambulatory Visit (HOSPITAL_COMMUNITY)
Admission: RE | Admit: 2017-10-02 | Discharge: 2017-10-02 | Disposition: A | Discharge status: Home / Self Care | Payer: BLUE CROSS/BLUE SHIELD | Source: Ambulatory Visit | Attending: Family Medicine | Admitting: Family Medicine

## 2017-10-02 ENCOUNTER — Ambulatory Visit (INDEPENDENT_AMBULATORY_CARE_PROVIDER_SITE_OTHER): Payer: BLUE CROSS/BLUE SHIELD | Admitting: Obstetrics & Gynecology

## 2017-10-02 VITALS — BP 135/58 | HR 55 | Wt 399.0 lb

## 2017-10-02 DIAGNOSIS — O09291 Supervision of pregnancy with other poor reproductive or obstetric history, first trimester: Secondary | ICD-10-CM | POA: Insufficient documentation

## 2017-10-02 DIAGNOSIS — Z3A19 19 weeks gestation of pregnancy: Secondary | ICD-10-CM | POA: Diagnosis not present

## 2017-10-02 DIAGNOSIS — O09299 Supervision of pregnancy with other poor reproductive or obstetric history, unspecified trimester: Secondary | ICD-10-CM

## 2017-10-02 DIAGNOSIS — Z8759 Personal history of other complications of pregnancy, childbirth and the puerperium: Secondary | ICD-10-CM

## 2017-10-02 DIAGNOSIS — O0990 Supervision of high risk pregnancy, unspecified, unspecified trimester: Secondary | ICD-10-CM

## 2017-10-02 DIAGNOSIS — Z363 Encounter for antenatal screening for malformations: Secondary | ICD-10-CM | POA: Diagnosis not present

## 2017-10-02 DIAGNOSIS — Z23 Encounter for immunization: Secondary | ICD-10-CM

## 2017-10-02 DIAGNOSIS — O99212 Obesity complicating pregnancy, second trimester: Secondary | ICD-10-CM | POA: Insufficient documentation

## 2017-10-02 DIAGNOSIS — O0992 Supervision of high risk pregnancy, unspecified, second trimester: Secondary | ICD-10-CM

## 2017-10-02 DIAGNOSIS — O24410 Gestational diabetes mellitus in pregnancy, diet controlled: Secondary | ICD-10-CM

## 2017-10-02 DIAGNOSIS — Z369 Encounter for antenatal screening, unspecified: Secondary | ICD-10-CM

## 2017-10-02 DIAGNOSIS — Z8619 Personal history of other infectious and parasitic diseases: Secondary | ICD-10-CM

## 2017-10-02 LAB — GLUCOSE, CAPILLARY: GLUCOSE-CAPILLARY: 107 mg/dL — AB (ref 65–99)

## 2017-10-02 MED ORDER — TRIAMCINOLONE ACETONIDE 0.5 % EX CREA: TOPICAL_CREAM | Freq: Two times a day (BID) | CUTANEOUS | Status: DC

## 2017-10-02 NOTE — Progress Notes (Signed)
Patient reports more cramping in past week as well as increase in vomiting. Patient reports losing bowel control & vaginal spotting with vomiting    PRENATAL VISIT NOTE  Subjective:  Jamie Taylor is a 31 y.o. G2P1001 at [redacted]w[redacted]d being seen today for ongoing prenatal care.  She is currently monitored for the following issues for this high-risk pregnancy and has History of pre-eclampsia; Supervision of high risk pregnancy, antepartum; Morbid obesity (HCC); Genital herpes; and History of shingles on her problem list.  Patient reports vomiting  Pt unable to get diclegis to insurance reasons.  RN to work on this today.  Pt does not want to take zofran due to constipation.  Pt can take 4 mg rather 8 mg but she declines this at this time.  Pt also declines phenergan due to lethargy.  Contractions: Not present. Vag. Bleeding: Scant.  Movement: Present. Denies leaking of fluid.   The following portions of the patient's history were reviewed and updated as appropriate: allergies, current medications, past family history, past medical history, past social history, past surgical history and problem list. Problem list updated.  Objective:   Vitals:   10/02/17 0757  BP: (!) 135/58  Pulse: (!) 55  Weight: (!) 399 lb (181 kg)    Fetal Status: Fetal Heart Rate (bpm): 140   Movement: Present     General:  Alert, oriented and cooperative. Patient is in no acute distress.  Skin: Skin is warm and dry. Dark scaly patches on right buttocks cheek   Cardiovascular: Normal heart rate noted  Respiratory: Normal respiratory effort, no problems with respiration noted  Abdomen: Soft, gravid, appropriate for gestational age.  Pain/Pressure: Present     Pelvic: Cervical exam deferred        Extremities: Normal range of motion.  Edema: Trace  Mental Status:  Normal mood and affect. Normal behavior. Normal judgment and thought content.   Assessment and Plan:  Pregnancy: G2P1001 at [redacted]w[redacted]d  1. Supervision of high  risk pregnancy, antepartum - Anatomy US today--will evaluate placenta (spotting last week); none now. - Flu Vaccine QUAD 36+ mos IM (Fluarix, Quad PF) - AFP TETRA  2. History of pre-eclampsia - Continue asa - Borderline BP  3. History of shingles  4.  Difficult epidural  5.  Steroids for scaly plaques on buttocks.  6.  Fasting CBG = 107; Pt needs 2 hour GTT which we will do today.  Preterm labor symptoms and general obstetric precautions including but not limited to vaginal bleeding, contractions, leaking of fluid and fetal movement were reviewed in detail with the patient. Please refer to After Visit Summary for other counseling recommendations.  No Follow-up on file.   Elsie Lincoln, MD

## 2017-10-03 ENCOUNTER — Encounter: Payer: Self-pay | Admitting: General Practice

## 2017-10-03 ENCOUNTER — Other Ambulatory Visit: Payer: Self-pay

## 2017-10-03 ENCOUNTER — Other Ambulatory Visit: Payer: BLUE CROSS/BLUE SHIELD

## 2017-10-03 MED ORDER — TRIAMCINOLONE 0.1 % CREAM:EUCERIN CREAM 1:1: 1.0000 "application " | TOPICAL_CREAM | Freq: Two times a day (BID) | CUTANEOUS | 2 refills | Status: DC

## 2017-10-04 LAB — AFP TETRA
DIA MOM VALUE: 0.63
DIA VALUE (EIA): 72.35 pg/mL
DSR (By Age)    1 IN: 412
DSR (SECOND TRIMESTER) 1 IN: 2515
GESTATIONAL AGE AFP: 19.1 wk
MATERNAL AGE AT EDD: 33.4 a
MSAFP Mom: 0.92
MSAFP: 31.4 ng/mL
MSHCG Mom: 1.1
MSHCG: 16592 m[IU]/mL
Osb Risk: 10000
Test Results:: NEGATIVE
UE3 MOM: 0.76
WEIGHT: 399 [lb_av]
uE3 Value: 0.98 ng/mL

## 2017-10-04 LAB — GLUCOSE TOLERANCE, 2 HOURS W/ 1HR
GLUCOSE, 1 HOUR: 134 mg/dL (ref 65–179)
GLUCOSE, 2 HOUR: 128 mg/dL (ref 65–152)
Glucose, Fasting: 98 mg/dL — ABNORMAL HIGH (ref 65–91)

## 2017-10-05 ENCOUNTER — Ambulatory Visit (HOSPITAL_COMMUNITY): Admission: RE | Admit: 2017-10-05 | Payer: BLUE CROSS/BLUE SHIELD | Source: Ambulatory Visit

## 2017-10-05 ENCOUNTER — Encounter: Payer: Self-pay | Admitting: Obstetrics & Gynecology

## 2017-10-05 DIAGNOSIS — O24419 Gestational diabetes mellitus in pregnancy, unspecified control: Secondary | ICD-10-CM | POA: Insufficient documentation

## 2017-10-05 NOTE — Telephone Encounter (Signed)
Per chart review patient seen in our office 10/02/17 and discussed with provider.

## 2017-10-11 ENCOUNTER — Encounter: Payer: Self-pay | Admitting: Obstetrics & Gynecology

## 2017-10-11 ENCOUNTER — Encounter: Payer: Self-pay | Admitting: Family Medicine

## 2017-10-12 ENCOUNTER — Ambulatory Visit (HOSPITAL_COMMUNITY)
Admission: RE | Admit: 2017-10-12 | Discharge: 2017-10-12 | Disposition: A | Discharge status: Home / Self Care | Payer: BLUE CROSS/BLUE SHIELD | Source: Ambulatory Visit | Attending: Obstetrics & Gynecology | Admitting: Obstetrics & Gynecology

## 2017-10-12 ENCOUNTER — Encounter: Payer: BLUE CROSS/BLUE SHIELD | Attending: Obstetrics & Gynecology | Admitting: *Deleted

## 2017-10-12 DIAGNOSIS — Z713 Dietary counseling and surveillance: Secondary | ICD-10-CM | POA: Diagnosis present

## 2017-10-12 DIAGNOSIS — O24410 Gestational diabetes mellitus in pregnancy, diet controlled: Secondary | ICD-10-CM | POA: Diagnosis not present

## 2017-10-12 DIAGNOSIS — O0992 Supervision of high risk pregnancy, unspecified, second trimester: Secondary | ICD-10-CM | POA: Diagnosis not present

## 2017-10-12 DIAGNOSIS — R7309 Other abnormal glucose: Secondary | ICD-10-CM

## 2017-10-12 NOTE — Progress Notes (Signed)
  Patient was seen on 10/12/2017 for Gestational Diabetes self-management . Patient states no history of diabetes or GDM. EDD is 02/25/2018. Diet history obtained. 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 I am not sure which meter her [employer] will cover so she agrees to call them to find out. The following learning objectives were met by the patient :   States the definition of Gestational Diabetes  States why dietary management is important in controlling blood glucose  Describes the effects of carbohydrates on blood glucose levels  Demonstrates ability to create a balanced meal plan  Demonstrates carbohydrate counting   States when to check blood glucose levels  Demonstrates proper blood glucose monitoring techniques  States the effect of stress and exercise on blood glucose levels  States the importance of limiting caffeine and abstaining from alcohol and smoking  Plan:  Aim for 4 Carb Choices per meal (45 grams) +/- 1 either way  Aim for 1-2 Carbs per snack Begin reading food labels for Total Carbohydrate of foods Consider  increasing your activity level by walking or other activity daily as tolerated Begin checking BG before breakfast and 2 hours after first bite of breakfast, lunch and dinner as directed by MD  Patient was signed up for [employer] and understands to add her BG to the APP every time she checks, not wait to end of day Take medication if directed by MD  Blood glucose monitor Rx called into pharmacy once she determines which one is covered by her insurance. I demonstrated use of basic meter and two lancing devices including the Accu Chek Fast Clix. She is fine using lancing device that comes with her meter. Patient instructed to test pre breakfast and 2 hours each meal as directed by MD  Patient instructed to monitor glucose levels: FBS: 60 - 95 mg/dl 2 hour: <120 mg/dl  Patient  received the following handouts:  Nutrition Diabetes and Pregnancy  Carbohydrate Counting List  Patient will be seen for follow-up as needed.

## 2017-10-17 ENCOUNTER — Telehealth: Payer: Self-pay | Admitting: General Practice

## 2017-10-17 DIAGNOSIS — O24410 Gestational diabetes mellitus in pregnancy, diet controlled: Secondary | ICD-10-CM

## 2017-10-17 DIAGNOSIS — R21 Rash and other nonspecific skin eruption: Secondary | ICD-10-CM

## 2017-10-17 MED ORDER — TRIAMCINOLONE ACETONIDE 0.5 % EX OINT: TOPICAL_OINTMENT | CUTANEOUS | 0 refills | Status: DC

## 2017-10-17 MED ORDER — ACCU-CHEK FASTCLIX LANCETS MISC: 1.0000 | Freq: Four times a day (QID) | 12 refills | Status: DC

## 2017-10-17 MED ORDER — GLUCOSE BLOOD VI STRP: ORAL_STRIP | 12 refills | Status: DC

## 2017-10-17 MED ORDER — ACCU-CHEK GUIDE W/DEVICE KIT: 1.0000 | PACK | Freq: Once | 0 refills | Status: AC

## 2017-10-17 NOTE — Telephone Encounter (Signed)
Patient called into front office stating her pharmacy still doesn't have her diabetes supplies testing or her cream Rx. meds ordered/reordered. Called & informed patient. Patient verbalized understanding & had no questions

## 2017-10-27 ENCOUNTER — Telehealth: Payer: Self-pay | Admitting: *Deleted

## 2017-10-27 NOTE — Telephone Encounter (Signed)
Jamie Taylor called yesterday afternoon and left a message she went to [place] and was told her placenta was detaching. States they attempted to call us but couldn't reach us.  Wanted to know if anything else needed to be done.   I called Jamie Taylor and we discussed she had went to ER because she was having severe pain, no bleeding. States while there she had some contractions and they kept her overnight and discharged her yesterday. She states she is not having any bleeding and very little pain. She has an appointment Tuesday 0740. I instructed her to keep that appointment and to go to the closest ER if over the weekend she has severe pain again or bleeding. She is going to bring her records from her visit with her.

## 2017-10-30 ENCOUNTER — Encounter: Payer: BLUE CROSS/BLUE SHIELD | Admitting: Obstetrics and Gynecology

## 2017-10-31 ENCOUNTER — Encounter: Payer: Self-pay | Admitting: Obstetrics and Gynecology

## 2017-10-31 ENCOUNTER — Ambulatory Visit (INDEPENDENT_AMBULATORY_CARE_PROVIDER_SITE_OTHER): Payer: BLUE CROSS/BLUE SHIELD | Admitting: Obstetrics and Gynecology

## 2017-10-31 VITALS — BP 148/62 | HR 58 | Wt >= 6400 oz

## 2017-10-31 DIAGNOSIS — A6004 Herpesviral vulvovaginitis: Secondary | ICD-10-CM

## 2017-10-31 DIAGNOSIS — O24410 Gestational diabetes mellitus in pregnancy, diet controlled: Secondary | ICD-10-CM

## 2017-10-31 DIAGNOSIS — O0990 Supervision of high risk pregnancy, unspecified, unspecified trimester: Secondary | ICD-10-CM

## 2017-10-31 DIAGNOSIS — Z8759 Personal history of other complications of pregnancy, childbirth and the puerperium: Secondary | ICD-10-CM

## 2017-10-31 DIAGNOSIS — O10919 Unspecified pre-existing hypertension complicating pregnancy, unspecified trimester: Secondary | ICD-10-CM

## 2017-10-31 LAB — POCT URINALYSIS DIP (DEVICE)
Bilirubin Urine: NEGATIVE
Glucose, UA: NEGATIVE mg/dL
HGB URINE DIPSTICK: NEGATIVE
Ketones, ur: NEGATIVE mg/dL
Leukocytes, UA: NEGATIVE
Nitrite: NEGATIVE
Protein, ur: NEGATIVE mg/dL
Specific Gravity, Urine: 1.025 (ref 1.005–1.030)
Urobilinogen, UA: 1 mg/dL (ref 0.0–1.0)
pH: 7 (ref 5.0–8.0)

## 2017-10-31 LAB — GLUCOSE, CAPILLARY: Glucose-Capillary: 96 mg/dL (ref 65–99)

## 2017-10-31 NOTE — Progress Notes (Signed)
   PRENATAL VISIT NOTE  Subjective:  Jamie Taylor is a 31 y.o. G2P1001 at 7076w2d being seen today for ongoing prenatal care.  She is currently monitored for the following issues for this high-risk pregnancy and has History of pre-eclampsia; Supervision of high risk pregnancy, antepartum; Morbid obesity (HCC); Genital herpes; History of shingles; and GDM (gestational diabetes mellitus) on their problem list.  Patient reports seen in ED on 10/31 due to abdominal pain and diagnosed with a retroplacental clot.  Contractions: Not present. Vag. Bleeding: None.  Movement: Present. Denies leaking of fluid.   The following portions of the patient's history were reviewed and updated as appropriate: allergies, current medications, past family history, past medical history, past social history, past surgical history and problem list. Problem list updated.  Objective:   Vitals:   10/31/17 0801  BP: (!) 148/62  Pulse: (!) 58  Weight: (!) 401 lb 4.8 oz (182 kg)    Fetal Status: Fetal Heart Rate (bpm): 140   Movement: Present     General:  Alert, oriented and cooperative. Patient is in no acute distress.  Skin: Skin is warm and dry. No rash noted.   Cardiovascular: Normal heart rate noted  Respiratory: Normal respiratory effort, no problems with respiration noted  Abdomen: Soft, gravid, appropriate for gestational age.  Pain/Pressure: Present     Pelvic: Cervical exam deferred        Extremities: Normal range of motion.  Edema: None  Mental Status:  Normal mood and affect. Normal behavior. Normal judgment and thought content.   Assessment and Plan:  Pregnancy: G2P1001 at 3976w2d  1. Diet controlled gestational diabetes mellitus (GDM) in second trimester Patient did not bring CBG log and reports testing a few days before her meter stopped functioning. She reports all values ranging from 93-96 Random CBG 96 Patient advised to bring meter or log book next visit  2. Supervision of high risk  pregnancy, antepartum Patient is doing well Reviewed ultrasound report from [place]. Discussed findings with the patient Follow up ultrasound to complete anatomy and follow up on abruption scheduled on 11/20 - US MFM OB FOLLOW UP; Future  3. Herpes simplex vulvovaginitis Start prophylaxis at 34 weeks  4. History of pre-eclampsia Continue ASA Patient with CHTN not on any antihypertensive  Preterm labor symptoms and general obstetric precautions including but not limited to vaginal bleeding, contractions, leaking of fluid and fetal movement were reviewed in detail with the patient. Please refer to After Visit Summary for other counseling recommendations.  Return in about 3 weeks (around 11/21/2017) for ROB. to monitor BP and CBG   Catalina AntiguaPeggy Cleta Heatley, MD

## 2017-10-31 NOTE — Progress Notes (Signed)
OB US scheduled for November 20th @ 0830.  Pt notified.

## 2017-11-14 ENCOUNTER — Ambulatory Visit (HOSPITAL_COMMUNITY): Payer: BLUE CROSS/BLUE SHIELD

## 2017-11-22 ENCOUNTER — Ambulatory Visit (HOSPITAL_COMMUNITY)
Admission: RE | Admit: 2017-11-22 | Discharge: 2017-11-22 | Disposition: A | Discharge status: Home / Self Care | Payer: BLUE CROSS/BLUE SHIELD | Source: Ambulatory Visit | Attending: Obstetrics and Gynecology | Admitting: Obstetrics and Gynecology

## 2017-11-22 ENCOUNTER — Ambulatory Visit (INDEPENDENT_AMBULATORY_CARE_PROVIDER_SITE_OTHER): Payer: BLUE CROSS/BLUE SHIELD | Admitting: Advanced Practice Midwife

## 2017-11-22 ENCOUNTER — Encounter (HOSPITAL_COMMUNITY): Payer: Self-pay

## 2017-11-22 ENCOUNTER — Encounter: Payer: Self-pay | Admitting: Advanced Practice Midwife

## 2017-11-22 ENCOUNTER — Other Ambulatory Visit: Payer: Self-pay | Admitting: Obstetrics and Gynecology

## 2017-11-22 VITALS — BP 139/71 | HR 57 | Wt >= 6400 oz

## 2017-11-22 DIAGNOSIS — O0990 Supervision of high risk pregnancy, unspecified, unspecified trimester: Secondary | ICD-10-CM

## 2017-11-22 DIAGNOSIS — Z3A26 26 weeks gestation of pregnancy: Secondary | ICD-10-CM | POA: Insufficient documentation

## 2017-11-22 DIAGNOSIS — Z0489 Encounter for examination and observation for other specified reasons: Secondary | ICD-10-CM

## 2017-11-22 DIAGNOSIS — O0992 Supervision of high risk pregnancy, unspecified, second trimester: Secondary | ICD-10-CM | POA: Insufficient documentation

## 2017-11-22 DIAGNOSIS — O24415 Gestational diabetes mellitus in pregnancy, controlled by oral hypoglycemic drugs: Secondary | ICD-10-CM

## 2017-11-22 DIAGNOSIS — Z362 Encounter for other antenatal screening follow-up: Secondary | ICD-10-CM | POA: Diagnosis not present

## 2017-11-22 DIAGNOSIS — Z789 Other specified health status: Secondary | ICD-10-CM

## 2017-11-22 DIAGNOSIS — O99212 Obesity complicating pregnancy, second trimester: Secondary | ICD-10-CM

## 2017-11-22 DIAGNOSIS — O09292 Supervision of pregnancy with other poor reproductive or obstetric history, second trimester: Secondary | ICD-10-CM | POA: Insufficient documentation

## 2017-11-22 DIAGNOSIS — IMO0002 Reserved for concepts with insufficient information to code with codable children: Secondary | ICD-10-CM

## 2017-11-22 DIAGNOSIS — O10912 Unspecified pre-existing hypertension complicating pregnancy, second trimester: Secondary | ICD-10-CM

## 2017-11-22 DIAGNOSIS — O10919 Unspecified pre-existing hypertension complicating pregnancy, unspecified trimester: Secondary | ICD-10-CM

## 2017-11-22 HISTORY — DX: Gestational diabetes mellitus in pregnancy, unspecified control: O24.419

## 2017-11-22 LAB — POCT URINALYSIS DIP (DEVICE)
Bilirubin Urine: NEGATIVE
Glucose, UA: NEGATIVE mg/dL
KETONES UR: 15 mg/dL — AB
Nitrite: NEGATIVE
PH: 6.5 (ref 5.0–8.0)
PROTEIN: NEGATIVE mg/dL
Specific Gravity, Urine: 1.025 (ref 1.005–1.030)
UROBILINOGEN UA: 1 mg/dL (ref 0.0–1.0)

## 2017-11-22 MED ORDER — BREAST PUMP MISC: 0 refills | Status: DC

## 2017-11-22 MED ORDER — GLYBURIDE 2.5 MG PO TABS: 2.5000 mg | ORAL_TABLET | Freq: Every day | ORAL | 3 refills | Status: DC

## 2017-11-22 NOTE — Progress Notes (Signed)
   PRENATAL VISIT NOTE  Subjective:  Jamie IraniConstance Klinkner is a 31 y.o. G2P1001 at 2228w3d being seen today for ongoing prenatal care.  She is currently monitored for the following issues for this high-risk pregnancy and has History of pre-eclampsia; Supervision of high risk pregnancy, antepartum; Morbid obesity (HCC); Genital herpes; History of shingles; GDM (gestational diabetes mellitus); and Chronic hypertension during pregnancy, antepartum on their problem list.  Patient reports occasional contractions.  Contractions: Irritability. Vag. Bleeding: None.  Movement: Present. Denies leaking of fluid.   The following portions of the patient's history were reviewed and updated as appropriate: allergies, current medications, past family history, past medical history, past social history, past surgical history and problem list. Problem list updated.  Objective:   Vitals:   11/22/17 0750  BP: 139/71  Pulse: (!) 57  Weight: (!) 403 lb 9.6 oz (183.1 kg)    Fetal Status: Fetal Heart Rate (bpm): 138   Movement: Present     General:  Alert, oriented and cooperative. Patient is in no acute distress.  Skin: Skin is warm and dry. No rash noted.   Cardiovascular: Normal heart rate noted  Respiratory: Normal respiratory effort, no problems with respiration noted  Abdomen: Soft, gravid, appropriate for gestational age.  Pain/Pressure: Present     Pelvic: Cervical exam declined        Extremities: Normal range of motion.  Edema: None  Mental Status:  Normal mood and affect. Normal behavior. Normal judgment and thought content.   More than half of Fasting CBGs out of range. Few PP CBGs documented, but all in range.   Assessment and Plan:  Pregnancy: G2P1001 at 5528w3d  1. Supervision of high risk pregnancy, antepartum   2. Gestational diabetes mellitus (GDM) in second trimester controlled on oral hypoglycemic drug - Uncontrolled w/ diet - glyBURIDE (DIABETA) 2.5 MG tablet; Take 1 tablet (2.5 mg total)  by mouth at bedtime.  Dispense: 60 tablet; Refill: 3 - Urged pt to check CBGs consistently by most accurately dose meds.   Preterm labor symptoms and general obstetric precautions including but not limited to vaginal bleeding, contractions, leaking of fluid and fetal movement were reviewed in detail with the patient. Please refer to After Visit Summary for other counseling recommendations.  Return in about 2 weeks (around 12/06/2017) for ROB.   Dorathy KinsmanVirginia Allen Basista, CNM

## 2017-11-22 NOTE — Addendum Note (Signed)
Encounter addended by: Vivien RotaSmall, Sharifa Bucholz H, RT on: 11/22/2017 10:31 AM  Actions taken: Imaging Exam ended

## 2017-11-22 NOTE — Patient Instructions (Signed)
Breastfeeding Deciding to breastfeed is one of the best choices you can make for you and your baby. A change in hormones during pregnancy causes your breast tissue to grow and increases the number and size of your milk ducts. These hormones also allow proteins, sugars, and fats from your blood supply to make breast milk in your milk-producing glands. Hormones prevent breast milk from being released before your baby is born as well as prompt milk flow after birth. Once breastfeeding has begun, thoughts of your baby, as well as his or her sucking or crying, can stimulate the release of milk from your milk-producing glands. Benefits of breastfeeding For Your Baby  Your first milk (colostrum) helps your baby's digestive system function better.  There are antibodies in your milk that help your baby fight off infections.  Your baby has a lower incidence of asthma, allergies, and sudden infant death syndrome.  The nutrients in breast milk are better for your baby than infant formulas and are designed uniquely for your baby's needs.  Breast milk improves your baby's brain development.  Your baby is less likely to develop other conditions, such as childhood obesity, asthma, or type 2 diabetes mellitus.  For You  Breastfeeding helps to create a very special bond between you and your baby.  Breastfeeding is convenient. Breast milk is always available at the correct temperature and costs nothing.  Breastfeeding helps to burn calories and helps you lose the weight gained during pregnancy.  Breastfeeding makes your uterus contract to its prepregnancy size faster and slows bleeding (lochia) after you give birth.  Breastfeeding helps to lower your risk of developing type 2 diabetes mellitus, osteoporosis, and breast or ovarian cancer later in life.  Signs that your baby is hungry Early Signs of Hunger  Increased alertness or activity.  Stretching.  Movement of the head from side to  side.  Movement of the head and opening of the mouth when the corner of the mouth or cheek is stroked (rooting).  Increased sucking sounds, smacking lips, cooing, sighing, or squeaking.  Hand-to-mouth movements.  Increased sucking of fingers or hands.  Late Signs of Hunger  Fussing.  Intermittent crying.  Extreme Signs of Hunger Signs of extreme hunger will require calming and consoling before your baby will be able to breastfeed successfully. Do not wait for the following signs of extreme hunger to occur before you initiate breastfeeding:  Restlessness.  A loud, strong cry.  Screaming.  Breastfeeding basics Breastfeeding Initiation  Find a comfortable place to sit or lie down, with your neck and back well supported.  Place a pillow or rolled up blanket under your baby to bring him or her to the level of your breast (if you are seated). Nursing pillows are specially designed to help support your arms and your baby while you breastfeed.  Make sure that your baby's abdomen is facing your abdomen.  Gently massage your breast. With your fingertips, massage from your chest wall toward your nipple in a circular motion. This encourages milk flow. You may need to continue this action during the feeding if your milk flows slowly.  Support your breast with 4 fingers underneath and your thumb above your nipple. Make sure your fingers are well away from your nipple and your baby's mouth.  Stroke your baby's lips gently with your finger or nipple.  When your baby's mouth is open wide enough, quickly bring your baby to your breast, placing your entire nipple and as much of the colored area   around your nipple (areola) as possible into your baby's mouth. ? More areola should be visible above your baby's upper lip than below the lower lip. ? Your baby's tongue should be between his or her lower gum and your breast.  Ensure that your baby's mouth is correctly positioned around your nipple  (latched). Your baby's lips should create a seal on your breast and be turned out (everted).  It is common for your baby to suck about 2-3 minutes in order to start the flow of breast milk.  Latching Teaching your baby how to latch on to your breast properly is very important. An improper latch can cause nipple pain and decreased milk supply for you and poor weight gain in your baby. Also, if your baby is not latched onto your nipple properly, he or she may swallow some air during feeding. This can make your baby fussy. Burping your baby when you switch breasts during the feeding can help to get rid of the air. However, teaching your baby to latch on properly is still the best way to prevent fussiness from swallowing air while breastfeeding. Signs that your baby has successfully latched on to your nipple:  Silent tugging or silent sucking, without causing you pain.  Swallowing heard between every 3-4 sucks.  Muscle movement above and in front of his or her ears while sucking.  Signs that your baby has not successfully latched on to nipple:  Sucking sounds or smacking sounds from your baby while breastfeeding.  Nipple pain.  If you think your baby has not latched on correctly, slip your finger into the corner of your baby's mouth to break the suction and place it between your baby's gums. Attempt breastfeeding initiation again. Signs of Successful Breastfeeding Signs from your baby:  A gradual decrease in the number of sucks or complete cessation of sucking.  Falling asleep.  Relaxation of his or her body.  Retention of a small amount of milk in his or her mouth.  Letting go of your breast by himself or herself.  Signs from you:  Breasts that have increased in firmness, weight, and size 1-3 hours after feeding.  Breasts that are softer immediately after breastfeeding.  Increased milk volume, as well as a change in milk consistency and color by the fifth day of  breastfeeding.  Nipples that are not sore, cracked, or bleeding.  Signs That Your Baby is Getting Enough Milk  Wetting at least 1-2 diapers during the first 24 hours after birth.  Wetting at least 5-6 diapers every 24 hours for the first week after birth. The urine should be clear or pale yellow by 5 days after birth.  Wetting 6-8 diapers every 24 hours as your baby continues to grow and develop.  At least 3 stools in a 24-hour period by age 5 days. The stool should be soft and yellow.  At least 3 stools in a 24-hour period by age 7 days. The stool should be seedy and yellow.  No loss of weight greater than 10% of birth weight during the first 3 days of age.  Average weight gain of 4-7 ounces (113-198 g) per week after age 4 days.  Consistent daily weight gain by age 5 days, without weight loss after the age of 2 weeks.  After a feeding, your baby may spit up a small amount. This is common. Breastfeeding frequency and duration Frequent feeding will help you make more milk and can prevent sore nipples and breast engorgement. Breastfeed when   you feel the need to reduce the fullness of your breasts or when your baby shows signs of hunger. This is called "breastfeeding on demand." Avoid introducing a pacifier to your baby while you are working to establish breastfeeding (the first 4-6 weeks after your baby is born). After this time you may choose to use a pacifier. Research has shown that pacifier use during the first year of a baby's life decreases the risk of sudden infant death syndrome (SIDS). Allow your baby to feed on each breast as long as he or she wants. Breastfeed until your baby is finished feeding. When your baby unlatches or falls asleep while feeding from the first breast, offer the second breast. Because newborns are often sleepy in the first few weeks of life, you may need to awaken your baby to get him or her to feed. Breastfeeding times will vary from baby to baby. However,  the following rules can serve as a guide to help you ensure that your baby is properly fed:  Newborns (babies 4 weeks of age or younger) may breastfeed every 1-3 hours.  Newborns should not go longer than 3 hours during the day or 5 hours during the night without breastfeeding.  You should breastfeed your baby a minimum of 8 times in a 24-hour period until you begin to introduce solid foods to your baby at around 6 months of age.  Breast milk pumping Pumping and storing breast milk allows you to ensure that your baby is exclusively fed your breast milk, even at times when you are unable to breastfeed. This is especially important if you are going back to work while you are still breastfeeding or when you are not able to be present during feedings. Your lactation consultant can give you guidelines on how long it is safe to store breast milk. A breast pump is a machine that allows you to pump milk from your breast into a sterile bottle. The pumped breast milk can then be stored in a refrigerator or freezer. Some breast pumps are operated by hand, while others use electricity. Ask your lactation consultant which type will work best for you. Breast pumps can be purchased, but some hospitals and breastfeeding support groups lease breast pumps on a monthly basis. A lactation consultant can teach you how to hand express breast milk, if you prefer not to use a pump. Caring for your breasts while you breastfeed Nipples can become dry, cracked, and sore while breastfeeding. The following recommendations can help keep your breasts moisturized and healthy:  Avoid using soap on your nipples.  Wear a supportive bra. Although not required, special nursing bras and tank tops are designed to allow access to your breasts for breastfeeding without taking off your entire bra or top. Avoid wearing underwire-style bras or extremely tight bras.  Air dry your nipples for 3-4minutes after each feeding.  Use only cotton  bra pads to absorb leaked breast milk. Leaking of breast milk between feedings is normal.  Use lanolin on your nipples after breastfeeding. Lanolin helps to maintain your skin's normal moisture barrier. If you use pure lanolin, you do not need to wash it off before feeding your baby again. Pure lanolin is not toxic to your baby. You may also hand express a few drops of breast milk and gently massage that milk into your nipples and allow the milk to air dry.  In the first few weeks after giving birth, some women experience extremely full breasts (engorgement). Engorgement can make your   breasts feel heavy, warm, and tender to the touch. Engorgement peaks within 3-5 days after you give birth. The following recommendations can help ease engorgement:  Completely empty your breasts while breastfeeding or pumping. You may want to start by applying warm, moist heat (in the shower or with warm water-soaked hand towels) just before feeding or pumping. This increases circulation and helps the milk flow. If your baby does not completely empty your breasts while breastfeeding, pump any extra milk after he or she is finished.  Wear a snug bra (nursing or regular) or tank top for 1-2 days to signal your body to slightly decrease milk production.  Apply ice packs to your breasts, unless this is too uncomfortable for you.  Make sure that your baby is latched on and positioned properly while breastfeeding.  If engorgement persists after 48 hours of following these recommendations, contact your health care provider or a lactation consultant. Overall health care recommendations while breastfeeding  Eat healthy foods. Alternate between meals and snacks, eating 3 of each per day. Because what you eat affects your breast milk, some of the foods may make your baby more irritable than usual. Avoid eating these foods if you are sure that they are negatively affecting your baby.  Drink milk, fruit juice, and water to  satisfy your thirst (about 10 glasses a day).  Rest often, relax, and continue to take your prenatal vitamins to prevent fatigue, stress, and anemia.  Continue breast self-awareness checks.  Avoid chewing and smoking tobacco. Chemicals from cigarettes that pass into breast milk and exposure to secondhand smoke may harm your baby.  Avoid alcohol and drug use, including marijuana. Some medicines that may be harmful to your baby can pass through breast milk. It is important to ask your health care provider before taking any medicine, including all over-the-counter and prescription medicine as well as vitamin and herbal supplements. It is possible to become pregnant while breastfeeding. If birth control is desired, ask your health care provider about options that will be safe for your baby. Contact a health care provider if:  You feel like you want to stop breastfeeding or have become frustrated with breastfeeding.  You have painful breasts or nipples.  Your nipples are cracked or bleeding.  Your breasts are red, tender, or warm.  You have a swollen area on either breast.  You have a fever or chills.  You have nausea or vomiting.  You have drainage other than breast milk from your nipples.  Your breasts do not become full before feedings by the fifth day after you give birth.  You feel sad and depressed.  Your baby is too sleepy to eat well.  Your baby is having trouble sleeping.  Your baby is wetting less than 3 diapers in a 24-hour period.  Your baby has less than 3 stools in a 24-hour period.  Your baby's skin or the white part of his or her eyes becomes yellow.  Your baby is not gaining weight by 5 days of age. Get help right away if:  Your baby is overly tired (lethargic) and does not want to wake up and feed.  Your baby develops an unexplained fever. This information is not intended to replace advice given to you by your health care provider. Make sure you discuss  any questions you have with your health care provider. Document Released: 12/12/2005 Document Revised: 05/25/2016 Document Reviewed: 06/05/2013 Elsevier Interactive Patient Education  2017 [employer]  

## 2017-11-30 ENCOUNTER — Encounter: Payer: Self-pay | Admitting: General Practice

## 2017-12-07 ENCOUNTER — Encounter: Payer: Self-pay | Admitting: Obstetrics and Gynecology

## 2017-12-07 ENCOUNTER — Telehealth: Payer: Self-pay | Admitting: *Deleted

## 2017-12-07 ENCOUNTER — Ambulatory Visit (INDEPENDENT_AMBULATORY_CARE_PROVIDER_SITE_OTHER): Payer: BLUE CROSS/BLUE SHIELD | Admitting: Obstetrics and Gynecology

## 2017-12-07 VITALS — BP 131/63 | HR 62 | Wt >= 6400 oz

## 2017-12-07 DIAGNOSIS — O10919 Unspecified pre-existing hypertension complicating pregnancy, unspecified trimester: Secondary | ICD-10-CM

## 2017-12-07 DIAGNOSIS — O24415 Gestational diabetes mellitus in pregnancy, controlled by oral hypoglycemic drugs: Secondary | ICD-10-CM

## 2017-12-07 DIAGNOSIS — A6004 Herpesviral vulvovaginitis: Secondary | ICD-10-CM

## 2017-12-07 DIAGNOSIS — O0993 Supervision of high risk pregnancy, unspecified, third trimester: Secondary | ICD-10-CM

## 2017-12-07 DIAGNOSIS — O0990 Supervision of high risk pregnancy, unspecified, unspecified trimester: Secondary | ICD-10-CM

## 2017-12-07 DIAGNOSIS — O10913 Unspecified pre-existing hypertension complicating pregnancy, third trimester: Secondary | ICD-10-CM

## 2017-12-07 DIAGNOSIS — Z23 Encounter for immunization: Secondary | ICD-10-CM | POA: Diagnosis not present

## 2017-12-07 LAB — POCT URINALYSIS DIP (DEVICE)
BILIRUBIN URINE: NEGATIVE
GLUCOSE, UA: NEGATIVE mg/dL
KETONES UR: 40 mg/dL — AB
LEUKOCYTES UA: NEGATIVE
NITRITE: NEGATIVE
Protein, ur: NEGATIVE mg/dL
Specific Gravity, Urine: >=1.03 (ref 1.005–1.030)
UROBILINOGEN UA: 0.2 mg/dL (ref 0.0–1.0)
pH: 6 (ref 5.0–8.0)

## 2017-12-07 NOTE — Telephone Encounter (Signed)
Pt notified that I have spoken with [employer] and her glyburide has been approved. I have also notified her [facility]. Patient voiced understanding.

## 2017-12-07 NOTE — Progress Notes (Signed)
   PRENATAL VISIT NOTE  Subjective:  Jamie Taylor is a 31 y.o. G2P1001 at 3747w4d being seen today for ongoing prenatal care.  She is currently monitored for the following issues for this high-risk pregnancy and has History of pre-eclampsia; Supervision of high risk pregnancy, antepartum; Morbid obesity (HCC); Genital herpes; History of shingles; GDM (gestational diabetes mellitus); and Chronic hypertension during pregnancy, antepartum on their problem list.  Patient reports irregular contractions that resolve with rest.  Contractions: Irregular. Vag. Bleeding: None.  Movement: Present. Denies leaking of fluid.   gDM Ordered for glyburide 2.5 mg QHS, but not taking due to insurance issues FG: low 100s, high 90s PP: around 110-113  The following portions of the patient's history were reviewed and updated as appropriate: allergies, current medications, past family history, past medical history, past social history, past surgical history and problem list. Problem list updated.  Objective:   Vitals:   12/07/17 0833  BP: 131/63  Pulse: 62  Weight: (!) 400 lb 14.4 oz (181.8 kg)    Fetal Status: Fetal Heart Rate (bpm): 145   Movement: Present     General:  Alert, oriented and cooperative. Patient is in no acute distress.  Skin: Skin is warm and dry. No rash noted.   Cardiovascular: Normal heart rate noted  Respiratory: Normal respiratory effort, no problems with respiration noted  Abdomen: Soft, gravid, appropriate for gestational age.  Pain/Pressure: Absent     Pelvic: Cervical exam deferred        Extremities: Normal range of motion.  Edema: Trace  Mental Status:  Normal mood and affect. Normal behavior. Normal judgment and thought content.   Assessment and Plan:  Pregnancy: G2P1001 at 4247w4d  1. Chronic hypertension in obstetric context, antepartum No meds On baby ASA  2. Morbid obesity (HCC)  3. Supervision of high risk pregnancy, antepartum - CBC - RPR - HIV antibody  (with reflex) Tdap today Reviewed options for birth control including oral contraceptive pills (combination and progesterone only), NuvaRing, Depo-Provera, Nexplanon, IUDs (copper and levonorgestrol) and BTL. Thoroughly reviewed risks/benefits/side effects of each. Answered all questions.   4. Gestational diabetes mellitus (GDM) in second trimester controlled on oral hypoglycemic drug Ordered to start glyburide but has not started due to insurance issues Fasting glucose still high, to start glyburide Needs testing starting 32 weeks Repeat growth US ordered  5. Herpes simplex vulvovaginitis ppx at 34 weeks   Preterm labor symptoms and general obstetric precautions including but not limited to vaginal bleeding, contractions, leaking of fluid and fetal movement were reviewed in detail with the patient. Please refer to After Visit Summary for other counseling recommendations.  Return in about 2 weeks (around 12/21/2017) for OB visit (MD).   Conan BowensKelly M Vernie Vinciguerra, MD

## 2017-12-07 NOTE — Patient Instructions (Signed)
Contraception Choices Contraception (birth control) is the use of any methods or devices to prevent pregnancy. Below are some methods to help avoid pregnancy. Hormonal methods  Contraceptive implant. This is a thin, plastic tube containing progesterone hormone. It does not contain estrogen hormone. Your health care provider inserts the tube in the inner part of the upper arm. The tube can remain in place for up to 3 years. After 3 years, the implant must be removed. The implant prevents the ovaries from releasing an egg (ovulation), thickens the cervical mucus to prevent sperm from entering the uterus, and thins the lining of the inside of the uterus.  Progesterone-only injections. These injections are given every 3 months by your health care provider to prevent pregnancy. This synthetic progesterone hormone stops the ovaries from releasing eggs. It also thickens cervical mucus and changes the uterine lining. This makes it harder for sperm to survive in the uterus.  Birth control pills. These pills contain estrogen and progesterone hormone. They work by preventing the ovaries from releasing eggs (ovulation). They also cause the cervical mucus to thicken, preventing the sperm from entering the uterus. Birth control pills are prescribed by a health care provider.Birth control pills can also be used to treat heavy periods.  Minipill. This type of birth control pill contains only the progesterone hormone. They are taken every day of each month and must be prescribed by your health care provider.  Birth control patch. The patch contains hormones similar to those in birth control pills. It must be changed once a week and is prescribed by a health care provider.  Vaginal ring. The ring contains hormones similar to those in birth control pills. It is left in the vagina for 3 weeks, removed for 1 week, and then a new one is put back in place. The patient must be comfortable inserting and removing the ring from  the vagina.A health care provider's prescription is necessary.  Emergency contraception. Emergency contraceptives prevent pregnancy after unprotected sexual intercourse. This pill can be taken right after sex or up to 5 days after unprotected sex. It is most effective the sooner you take the pills after having sexual intercourse. Most emergency contraceptive pills are available without a prescription. Check with your pharmacist. Do not use emergency contraception as your only form of birth control. Barrier methods  Female condom. This is a thin sheath (latex or rubber) that is worn over the penis during sexual intercourse. It can be used with spermicide to increase effectiveness.  Female condom. This is a soft, loose-fitting sheath that is put into the vagina before sexual intercourse.  Diaphragm. This is a soft, latex, dome-shaped barrier that must be fitted by a health care provider. It is inserted into the vagina, along with a spermicidal jelly. It is inserted before intercourse. The diaphragm should be left in the vagina for 6 to 8 hours after intercourse.  Cervical cap. This is a round, soft, latex or plastic cup that fits over the cervix and must be fitted by a health care provider. The cap can be left in place for up to 48 hours after intercourse.  Sponge. This is a soft, circular piece of polyurethane foam. The sponge has spermicide in it. It is inserted into the vagina after wetting it and before sexual intercourse.  Spermicides. These are chemicals that kill or block sperm from entering the cervix and uterus. They come in the form of creams, jellies, suppositories, foam, or tablets. They do not require a prescription. They   are inserted into the vagina with an applicator before having sexual intercourse. The process must be repeated every time you have sexual intercourse. Intrauterine contraception  Intrauterine device (IUD). This is a T-shaped device that is put in a woman's uterus during  a menstrual period to prevent pregnancy. There are 2 types: ? Copper IUD. This type of IUD is wrapped in copper wire and is placed inside the uterus. Copper makes the uterus and fallopian tubes produce a fluid that kills sperm. It can stay in place for 10 years. ? Hormone IUD. This type of IUD contains the hormone progestin (synthetic progesterone). The hormone thickens the cervical mucus and prevents sperm from entering the uterus, and it also thins the uterine lining to prevent implantation of a fertilized egg. The hormone can weaken or kill the sperm that get into the uterus. It can stay in place for 3-5 years, depending on which type of IUD is used. Permanent methods of contraception  Female tubal ligation. This is when the woman's fallopian tubes are surgically sealed, tied, or blocked to prevent the egg from traveling to the uterus.  Hysteroscopic sterilization. This involves placing a small coil or insert into each fallopian tube. Your doctor uses a technique called hysteroscopy to do the procedure. The device causes scar tissue to form. This results in permanent blockage of the fallopian tubes, so the sperm cannot fertilize the egg. It takes about 3 months after the procedure for the tubes to become blocked. You must use another form of birth control for these 3 months.  Female sterilization. This is when the female has the tubes that carry sperm tied off (vasectomy).This blocks sperm from entering the vagina during sexual intercourse. After the procedure, the man can still ejaculate fluid (semen). Natural planning methods  Natural family planning. This is not having sexual intercourse or using a barrier method (condom, diaphragm, cervical cap) on days the woman could become pregnant.  Calendar method. This is keeping track of the length of each menstrual cycle and identifying when you are fertile.  Ovulation method. This is avoiding sexual intercourse during ovulation.  Symptothermal method.  This is avoiding sexual intercourse during ovulation, using a thermometer and ovulation symptoms.  Post-ovulation method. This is timing sexual intercourse after you have ovulated. Regardless of which type or method of contraception you choose, it is important that you use condoms to protect against the transmission of sexually transmitted infections (STIs). Talk with your health care provider about which form of contraception is most appropriate for you. This information is not intended to replace advice given to you by your health care provider. Make sure you discuss any questions you have with your health care provider. Document Released: 12/12/2005 Document Revised: 05/19/2016 Document Reviewed: 06/06/2013 Elsevier Interactive Patient Education  2017 [employer]  

## 2017-12-09 LAB — CBC
HEMOGLOBIN: 11.8 g/dL (ref 11.1–15.9)
Hematocrit: 36.3 % (ref 34.0–46.6)
MCH: 30.6 pg (ref 26.6–33.0)
MCHC: 32.5 g/dL (ref 31.5–35.7)
MCV: 94 fL (ref 79–97)
PLATELETS: 285 10*3/uL (ref 150–379)
RBC: 3.85 x10E6/uL (ref 3.77–5.28)
RDW: 13.9 % (ref 12.3–15.4)
WBC: 10.8 10*3/uL (ref 3.4–10.8)

## 2017-12-09 LAB — RPR, QUANT+TP ABS (REFLEX)
Rapid Plasma Reagin, Quant: 1:1 {titer} — ABNORMAL HIGH
T Pallidum Abs: NEGATIVE

## 2017-12-09 LAB — HIV ANTIBODY (ROUTINE TESTING W REFLEX): HIV Screen 4th Generation wRfx: NONREACTIVE

## 2017-12-09 LAB — RPR: RPR: REACTIVE — AB

## 2017-12-11 ENCOUNTER — Encounter: Payer: Self-pay | Admitting: Obstetrics and Gynecology

## 2017-12-11 ENCOUNTER — Other Ambulatory Visit: Payer: Self-pay | Admitting: Obstetrics and Gynecology

## 2017-12-11 DIAGNOSIS — R768 Other specified abnormal immunological findings in serum: Secondary | ICD-10-CM | POA: Insufficient documentation

## 2017-12-14 ENCOUNTER — Encounter: Payer: Self-pay | Admitting: General Practice

## 2017-12-21 ENCOUNTER — Other Ambulatory Visit: Payer: Self-pay | Admitting: Obstetrics and Gynecology

## 2017-12-21 ENCOUNTER — Other Ambulatory Visit (HOSPITAL_COMMUNITY): Payer: Self-pay | Admitting: *Deleted

## 2017-12-21 ENCOUNTER — Encounter (HOSPITAL_COMMUNITY): Payer: Self-pay

## 2017-12-21 ENCOUNTER — Ambulatory Visit (INDEPENDENT_AMBULATORY_CARE_PROVIDER_SITE_OTHER): Payer: BLUE CROSS/BLUE SHIELD | Admitting: Obstetrics and Gynecology

## 2017-12-21 ENCOUNTER — Ambulatory Visit (HOSPITAL_COMMUNITY)
Admission: RE | Admit: 2017-12-21 | Discharge: 2017-12-21 | Disposition: A | Discharge status: Home / Self Care | Payer: BLUE CROSS/BLUE SHIELD | Source: Ambulatory Visit | Attending: Obstetrics and Gynecology | Admitting: Obstetrics and Gynecology

## 2017-12-21 VITALS — BP 144/59 | HR 67 | Wt >= 6400 oz

## 2017-12-21 DIAGNOSIS — O99210 Obesity complicating pregnancy, unspecified trimester: Secondary | ICD-10-CM

## 2017-12-21 DIAGNOSIS — O10913 Unspecified pre-existing hypertension complicating pregnancy, third trimester: Secondary | ICD-10-CM

## 2017-12-21 DIAGNOSIS — O10919 Unspecified pre-existing hypertension complicating pregnancy, unspecified trimester: Secondary | ICD-10-CM

## 2017-12-21 DIAGNOSIS — O0993 Supervision of high risk pregnancy, unspecified, third trimester: Secondary | ICD-10-CM

## 2017-12-21 DIAGNOSIS — O24415 Gestational diabetes mellitus in pregnancy, controlled by oral hypoglycemic drugs: Secondary | ICD-10-CM

## 2017-12-21 DIAGNOSIS — R768 Other specified abnormal immunological findings in serum: Secondary | ICD-10-CM

## 2017-12-21 DIAGNOSIS — O24410 Gestational diabetes mellitus in pregnancy, diet controlled: Secondary | ICD-10-CM

## 2017-12-21 DIAGNOSIS — Z3A30 30 weeks gestation of pregnancy: Secondary | ICD-10-CM | POA: Diagnosis not present

## 2017-12-21 DIAGNOSIS — O0990 Supervision of high risk pregnancy, unspecified, unspecified trimester: Secondary | ICD-10-CM

## 2017-12-21 DIAGNOSIS — A6000 Herpesviral infection of urogenital system, unspecified: Secondary | ICD-10-CM

## 2017-12-21 DIAGNOSIS — O99213 Obesity complicating pregnancy, third trimester: Secondary | ICD-10-CM

## 2017-12-21 LAB — POCT URINALYSIS DIP (DEVICE)
Bilirubin Urine: NEGATIVE
Glucose, UA: NEGATIVE mg/dL
KETONES UR: 40 mg/dL — AB
LEUKOCYTES UA: NEGATIVE
Nitrite: NEGATIVE
Protein, ur: NEGATIVE mg/dL
UROBILINOGEN UA: 0.2 mg/dL (ref 0.0–1.0)
pH: 6.5 (ref 5.0–8.0)

## 2017-12-21 NOTE — Progress Notes (Signed)
Pt. States some sharp pains in lower abdomen when baby moves.

## 2017-12-21 NOTE — Progress Notes (Signed)
   PRENATAL VISIT NOTE  Subjective:  Jamie Taylor is a 31 y.o. G2P1001 at 448w4d being seen today for ongoing prenatal care.  She is currently monitored for the following issues for this high-risk pregnancy and has History of pre-eclampsia; Supervision of high risk pregnancy, antepartum; Morbid obesity (HCC); Genital herpes; History of shingles; GDM (gestational diabetes mellitus); Chronic hypertension during pregnancy, antepartum; and False positive syphilis serology on their problem list.  Patient reports occasional contractions, pelvic pressure.  Contractions: Irregular. Vag. Bleeding: None.  Movement: Present. Denies leaking of fluid. Occasional nausea  gDM - not taking glyburide due to insurance, issues, will be able to start Monday FG: 107 PP: 114-117  The following portions of the patient's history were reviewed and updated as appropriate: allergies, current medications, past family history, past medical history, past social history, past surgical history and problem list. Problem list updated.  Objective:   Vitals:   12/21/17 0754  BP: (!) 144/59  Pulse: 67  Weight: (!) 410 lb 12.8 oz (186.3 kg)    Fetal Status: Fetal Heart Rate (bpm): 135   Movement: Present     General:  Alert, oriented and cooperative. Patient is in no acute distress.  Skin: Skin is warm and dry. No rash noted.   Cardiovascular: Normal heart rate noted  Respiratory: Normal respiratory effort, no problems with respiration noted  Abdomen: Soft, gravid, appropriate for gestational age.  Pain/Pressure: Present     Pelvic: Cervical exam deferred        Extremities: Normal range of motion.  Edema: Mild pitting, slight indentation  Mental Status:  Normal mood and affect. Normal behavior. Normal judgment and thought content.   Assessment and Plan:  Pregnancy: G2P1001 at 848w4d  1. Diet controlled gestational diabetes mellitus (GDM), antepartum Ordered for glyburide several visits ago, not started due to  insurance issues Will be able to start Monday  2. Chronic hypertension during pregnancy, antepartum Not on meds Cont baby ASA Growth US today  3. Supervision of high risk pregnancy, antepartum  4. False positive syphilis serology Confirmatory test neg  5. Genital herpes simplex, unspecified site ppx 34 weeks  6. Morbid obesity (HCC)   Preterm labor symptoms and general obstetric precautions including but not limited to vaginal bleeding, contractions, leaking of fluid and fetal movement were reviewed in detail with the patient. Please refer to After Visit Summary for other counseling recommendations.  Return in about 2 weeks (around 01/04/2018) for OB visit (MD).   Conan BowensKelly M Davis, MD

## 2017-12-21 NOTE — Patient Instructions (Signed)
Contraception Choices Contraception, also called birth control, refers to methods or devices that prevent pregnancy. Hormonal methods Contraceptive implant A contraceptive implant is a thin, plastic tube that contains a hormone. It is inserted into the upper part of the arm. It can remain in place for up to 3 years. Progestin-only injections Progestin-only injections are injections of progestin, a synthetic form of the hormone progesterone. They are given every 3 months by a health care provider. Birth control pills Birth control pills are pills that contain hormones that prevent pregnancy. They must be taken once a day, preferably at the same time each day. Birth control patch The birth control patch contains hormones that prevent pregnancy. It is placed on the skin and must be changed once a week for three weeks and removed on the fourth week. A prescription is needed to use this method of contraception. Vaginal ring A vaginal ring contains hormones that prevent pregnancy. It is placed in the vagina for three weeks and removed on the fourth week. After that, the process is repeated with a new ring. A prescription is needed to use this method of contraception. Emergency contraceptive Emergency contraceptives prevent pregnancy after unprotected sex. They come in pill form and can be taken up to 5 days after sex. They work best the sooner they are taken after having sex. Most emergency contraceptives are available without a prescription. This method should not be used as your only form of birth control. Barrier methods Female condom A female condom is a thin sheath that is worn over the penis during sex. Condoms keep sperm from going inside a woman's body. They can be used with a spermicide to increase their effectiveness. They should be disposed after a single use. Female condom A female condom is a soft, loose-fitting sheath that is put into the vagina before sex. The condom keeps sperm from going  inside a woman's body. They should be disposed after a single use. Diaphragm A diaphragm is a soft, dome-shaped barrier. It is inserted into the vagina before sex, along with a spermicide. The diaphragm blocks sperm from entering the uterus, and the spermicide kills sperm. A diaphragm should be left in the vagina for 6-8 hours after sex and removed within 24 hours. A diaphragm is prescribed and fitted by a health care provider. A diaphragm should be replaced every 1-2 years, after giving birth, after gaining more than 15 lb (6.8 kg), and after pelvic surgery. Cervical cap A cervical cap is a round, soft latex or plastic cup that fits over the cervix. It is inserted into the vagina before sex, along with spermicide. It blocks sperm from entering the uterus. The cap should be left in place for 6-8 hours after sex and removed within 48 hours. A cervical cap must be prescribed and fitted by a health care provider. It should be replaced every 2 years. Sponge A sponge is a soft, circular piece of polyurethane foam with spermicide on it. The sponge helps block sperm from entering the uterus, and the spermicide kills sperm. To use it, you make it wet and then insert it into the vagina. It should be inserted before sex, left in for at least 6 hours after sex, and removed and thrown away within 30 hours. Spermicides Spermicides are chemicals that kill or block sperm from entering the cervix and uterus. They can come as a cream, jelly, suppository, foam, or tablet. A spermicide should be inserted into the vagina with an applicator at least 10-15 minutes before   sex to allow time for it to work. The process must be repeated every time you have sex. Spermicides do not require a prescription. Intrauterine contraception Intrauterine device (IUD) An IUD is a T-shaped device that is put in a woman's uterus. There are two types:  Hormone IUD.This type contains progestin, a synthetic form of the hormone progesterone. This  type can stay in place for 3-5 years.  Copper IUD.This type is wrapped in copper wire. It can stay in place for 10 years.  Permanent methods of contraception Female tubal ligation In this method, a woman's fallopian tubes are sealed, tied, or blocked during surgery to prevent eggs from traveling to the uterus. Hysteroscopic sterilization In this method, a small, flexible insert is placed into each fallopian tube. The inserts cause scar tissue to form in the fallopian tubes and block them, so sperm cannot reach an egg. The procedure takes about 3 months to be effective. Another form of birth control must be used during those 3 months. Female sterilization This is a procedure to tie off the tubes that carry sperm (vasectomy). After the procedure, the man can still ejaculate fluid (semen). Natural planning methods Natural family planning In this method, a couple does not have sex on days when the woman could become pregnant. Calendar method This means keeping track of the length of each menstrual cycle, identifying the days when pregnancy can happen, and not having sex on those days. Ovulation method In this method, a couple avoids sex during ovulation. Symptothermal method This method involves not having sex during ovulation. The woman typically checks for ovulation by watching changes in her temperature and in the consistency of cervical mucus. Post-ovulation method In this method, a couple waits to have sex until after ovulation. Summary  Contraception, also called birth control, means methods or devices that prevent pregnancy.  Hormonal methods of contraception include implants, injections, pills, patches, vaginal rings, and emergency contraceptives.  Barrier methods of contraception can include female condoms, female condoms, diaphragms, cervical caps, sponges, and spermicides.  There are two types of IUDs (intrauterine devices). An IUD can be put in a woman's uterus to prevent pregnancy  for 3-5 years.  Permanent sterilization can be done through a procedure for males, females, or both.  Natural family planning methods involve not having sex on days when the woman could become pregnant. This information is not intended to replace advice given to you by your health care provider. Make sure you discuss any questions you have with your health care provider. Document Released: 12/12/2005 Document Revised: 01/14/2017 Document Reviewed: 01/14/2017 Elsevier Interactive Patient Education  2018 [employer]  

## 2017-12-26 NOTE — L&D Delivery Note (Signed)
Delivery Note At  a viable female was delivered via  (Presentation:vertex ;  ).  APGAR: ,9 ; weight  .   Placenta status:spont ,shultz .  Cord:3vc  with the following complications:none .  Cord pH: vertex  Anesthesia:  epidural Episiotomy:   Lacerations:  none Suture Repair: n/a Est. Blood Loss 100 (mL):    Mom to postpartum.  Baby to Couplet care / Skin to Skin.  Jamie Taylor 02/18/2018, 11:54 AM

## 2017-12-28 ENCOUNTER — Encounter: Payer: BLUE CROSS/BLUE SHIELD | Admitting: Obstetrics and Gynecology

## 2018-01-01 ENCOUNTER — Other Ambulatory Visit: Payer: BLUE CROSS/BLUE SHIELD

## 2018-01-01 ENCOUNTER — Encounter: Payer: BLUE CROSS/BLUE SHIELD | Admitting: Family Medicine

## 2018-01-04 ENCOUNTER — Other Ambulatory Visit: Payer: BLUE CROSS/BLUE SHIELD

## 2018-01-04 ENCOUNTER — Encounter: Payer: BLUE CROSS/BLUE SHIELD | Admitting: Obstetrics & Gynecology

## 2018-01-05 ENCOUNTER — Other Ambulatory Visit: Payer: BLUE CROSS/BLUE SHIELD

## 2018-01-11 ENCOUNTER — Ambulatory Visit (INDEPENDENT_AMBULATORY_CARE_PROVIDER_SITE_OTHER): Payer: BLUE CROSS/BLUE SHIELD | Admitting: *Deleted

## 2018-01-11 ENCOUNTER — Encounter: Payer: Self-pay | Admitting: *Deleted

## 2018-01-11 ENCOUNTER — Ambulatory Visit: Payer: Self-pay

## 2018-01-11 ENCOUNTER — Ambulatory Visit (INDEPENDENT_AMBULATORY_CARE_PROVIDER_SITE_OTHER): Payer: BLUE CROSS/BLUE SHIELD | Admitting: Family Medicine

## 2018-01-11 VITALS — BP 134/70 | HR 66 | Wt >= 6400 oz

## 2018-01-11 DIAGNOSIS — O10919 Unspecified pre-existing hypertension complicating pregnancy, unspecified trimester: Secondary | ICD-10-CM | POA: Diagnosis not present

## 2018-01-11 DIAGNOSIS — O10913 Unspecified pre-existing hypertension complicating pregnancy, third trimester: Secondary | ICD-10-CM

## 2018-01-11 DIAGNOSIS — O0993 Supervision of high risk pregnancy, unspecified, third trimester: Secondary | ICD-10-CM

## 2018-01-11 DIAGNOSIS — O24415 Gestational diabetes mellitus in pregnancy, controlled by oral hypoglycemic drugs: Secondary | ICD-10-CM

## 2018-01-11 DIAGNOSIS — R768 Other specified abnormal immunological findings in serum: Secondary | ICD-10-CM

## 2018-01-11 DIAGNOSIS — Z029 Encounter for administrative examinations, unspecified: Secondary | ICD-10-CM

## 2018-01-11 DIAGNOSIS — A6000 Herpesviral infection of urogenital system, unspecified: Secondary | ICD-10-CM

## 2018-01-11 DIAGNOSIS — O0990 Supervision of high risk pregnancy, unspecified, unspecified trimester: Secondary | ICD-10-CM

## 2018-01-11 NOTE — Progress Notes (Signed)

## 2018-01-11 NOTE — Progress Notes (Signed)
Subjective:  Jamie IraniConstance Taylor is a 32 y.o. G2P1001 at 5829w4d being seen today for ongoing prenatal care.  She is currently monitored for the following issues for this high-risk pregnancy and has History of pre-eclampsia; Supervision of high risk pregnancy, antepartum; Morbid obesity (HCC); Genital herpes; History of shingles; GDM (gestational diabetes mellitus); Chronic hypertension during pregnancy, antepartum; and False positive syphilis serology on their problem list.  GDM: Patient taking glyburide at night.  Reports no hypoglycemic episodes.  Tolerating medication well. Did not bring log, but states that: Fasting: 90-92 2hr PP: controlled  Patient reports no complaints.  Contractions: Not present. Vag. Bleeding: None.  Movement: Present. Denies leaking of fluid.   The following portions of the patient's history were reviewed and updated as appropriate: allergies, current medications, past family history, past medical history, past social history, past surgical history and problem list. Problem list updated.  Objective:   Vitals:   01/11/18 0826  BP: 134/70  Pulse: 66  Weight: (!) 409 lb 3.2 oz (185.6 kg)    Fetal Status: Fetal Heart Rate (bpm): NST   Movement: Present     General:  Alert, oriented and cooperative. Patient is in no acute distress.  Skin: Skin is warm and dry. No rash noted.   Cardiovascular: Normal heart rate noted  Respiratory: Normal respiratory effort, no problems with respiration noted  Abdomen: Soft, gravid, appropriate for gestational age. Pain/Pressure: Present     Pelvic: Vag. Bleeding: None     Cervical exam deferred        Extremities: Normal range of motion.  Edema: Trace  Mental Status: Normal mood and affect. Normal behavior. Normal judgment and thought content.   Urinalysis:      Assessment and Plan:  Pregnancy: G2P1001 at 2729w4d  1. Supervision of high risk pregnancy, antepartum FHT normal - US MFM OB FOLLOW UP; Future - US MFM FETAL BPP WO NON  STRESS; Future  2. Chronic hypertension during pregnancy, antepartum BP controlled. - US MFM OB FOLLOW UP; Future - US MFM FETAL BPP WO NON STRESS; Future  3. Gestational diabetes mellitus (GDM) in third trimester controlled on oral hypoglycemic drug Reminded pt to bring blood sugar log - US MFM OB FOLLOW UP; Future - US MFM FETAL BPP WO NON STRESS; Future  4. False positive syphilis serology  5. Genital herpes simplex, unspecified site Start valtrex between 35-36 weeks  Preterm labor symptoms and general obstetric precautions including but not limited to vaginal bleeding, contractions, leaking of fluid and fetal movement were reviewed in detail with the patient. Please refer to After Visit Summary for other counseling recommendations.  Return in about 7 days (around 01/18/2018) for as scheduled.   Levie HeritageStinson, Elfida Shimada J, DO

## 2018-01-11 NOTE — Progress Notes (Signed)
US for growth scheduled on 1/24, BPP added.

## 2018-01-17 ENCOUNTER — Encounter: Payer: Self-pay | Admitting: General Practice

## 2018-01-18 ENCOUNTER — Other Ambulatory Visit: Payer: Self-pay | Admitting: Family Medicine

## 2018-01-18 ENCOUNTER — Ambulatory Visit (HOSPITAL_COMMUNITY)
Admission: RE | Admit: 2018-01-18 | Discharge: 2018-01-18 | Disposition: A | Discharge status: Home / Self Care | Payer: BLUE CROSS/BLUE SHIELD | Source: Ambulatory Visit | Attending: Obstetrics and Gynecology | Admitting: Obstetrics and Gynecology

## 2018-01-18 ENCOUNTER — Ambulatory Visit (INDEPENDENT_AMBULATORY_CARE_PROVIDER_SITE_OTHER): Payer: BLUE CROSS/BLUE SHIELD | Admitting: Obstetrics and Gynecology

## 2018-01-18 ENCOUNTER — Encounter (HOSPITAL_COMMUNITY): Payer: Self-pay

## 2018-01-18 ENCOUNTER — Ambulatory Visit (INDEPENDENT_AMBULATORY_CARE_PROVIDER_SITE_OTHER): Payer: BLUE CROSS/BLUE SHIELD | Admitting: *Deleted

## 2018-01-18 ENCOUNTER — Encounter: Payer: Self-pay | Admitting: Obstetrics and Gynecology

## 2018-01-18 VITALS — Wt >= 6400 oz

## 2018-01-18 DIAGNOSIS — Z6841 Body Mass Index (BMI) 40.0 and over, adult: Secondary | ICD-10-CM | POA: Diagnosis not present

## 2018-01-18 DIAGNOSIS — Z362 Encounter for other antenatal screening follow-up: Secondary | ICD-10-CM | POA: Insufficient documentation

## 2018-01-18 DIAGNOSIS — O10919 Unspecified pre-existing hypertension complicating pregnancy, unspecified trimester: Secondary | ICD-10-CM

## 2018-01-18 DIAGNOSIS — O10913 Unspecified pre-existing hypertension complicating pregnancy, third trimester: Secondary | ICD-10-CM | POA: Insufficient documentation

## 2018-01-18 DIAGNOSIS — O24415 Gestational diabetes mellitus in pregnancy, controlled by oral hypoglycemic drugs: Secondary | ICD-10-CM | POA: Insufficient documentation

## 2018-01-18 DIAGNOSIS — Z3A34 34 weeks gestation of pregnancy: Secondary | ICD-10-CM | POA: Insufficient documentation

## 2018-01-18 DIAGNOSIS — O0990 Supervision of high risk pregnancy, unspecified, unspecified trimester: Secondary | ICD-10-CM

## 2018-01-18 DIAGNOSIS — O09299 Supervision of pregnancy with other poor reproductive or obstetric history, unspecified trimester: Secondary | ICD-10-CM | POA: Diagnosis not present

## 2018-01-18 DIAGNOSIS — Z0489 Encounter for examination and observation for other specified reasons: Secondary | ICD-10-CM

## 2018-01-18 DIAGNOSIS — IMO0002 Reserved for concepts with insufficient information to code with codable children: Secondary | ICD-10-CM

## 2018-01-18 DIAGNOSIS — O99213 Obesity complicating pregnancy, third trimester: Secondary | ICD-10-CM | POA: Insufficient documentation

## 2018-01-18 DIAGNOSIS — Z8619 Personal history of other infectious and parasitic diseases: Secondary | ICD-10-CM

## 2018-01-18 LAB — POCT URINALYSIS DIP (DEVICE)
Glucose, UA: NEGATIVE mg/dL
KETONES UR: 40 mg/dL — AB
NITRITE: NEGATIVE
PH: 6 (ref 5.0–8.0)
PROTEIN: 30 mg/dL — AB
Specific Gravity, Urine: >=1.03 (ref 1.005–1.030)
Urobilinogen, UA: 0.2 mg/dL (ref 0.0–1.0)

## 2018-01-18 LAB — FETAL NONSTRESS TEST

## 2018-01-18 MED ORDER — VALACYCLOVIR HCL 500 MG PO TABS: 500.0000 mg | ORAL_TABLET | Freq: Two times a day (BID) | ORAL | 6 refills | Status: DC

## 2018-01-18 NOTE — Progress Notes (Signed)
Subjective:  Jamie IraniConstance Taylor is a 32 y.o. G2P1001 at 553w4d being seen today for ongoing prenatal care.  She is currently monitored for the following issues for this high-risk pregnancy and has History of pre-eclampsia; Supervision of high risk pregnancy, antepartum; Morbid obesity (HCC); History of shingles; GDM (gestational diabetes mellitus); Chronic hypertension during pregnancy, antepartum; False positive syphilis serology; and History of herpes genitalis on their problem list.  Patient reports no complaints.  Contractions: Not present. Vag. Bleeding: None.  Movement: Present. Denies leaking of fluid.   The following portions of the patient's history were reviewed and updated as appropriate: allergies, current medications, past family history, past medical history, past social history, past surgical history and problem list. Problem list updated.  Objective:   Vitals:   01/18/18 0930  Weight: (!) 408 lb (185.1 kg)    Fetal Status: Fetal Heart Rate (bpm): NST   Movement: Present     General:  Alert, oriented and cooperative. Patient is in no acute distress.  Skin: Skin is warm and dry. No rash noted.   Cardiovascular: Normal heart rate noted  Respiratory: Normal respiratory effort, no problems with respiration noted  Abdomen: Soft, gravid, appropriate for gestational age. Pain/Pressure: Present     Pelvic:  Cervical exam deferred        Extremities: Normal range of motion.  Edema: Trace  Mental Status: Normal mood and affect. Normal behavior. Normal judgment and thought content.   Urinalysis:      Assessment and Plan:  Pregnancy: G2P1001 at 553w4d  1. Supervision of high risk pregnancy, antepartum Stable  2. Gestational diabetes mellitus (GDM) in third trimester controlled on oral hypoglycemic drug CBG's in goal range Continue with diet U/S today, results pending  BPP 8/8 and RNST today Continue with weekly testing  3. Chronic hypertension during pregnancy, antepartum BP  stable without meds Continue with qd BASA Growth U/S and antenatal testing as noted above  4. History of herpes genitalis  - valACYclovir (VALTREX) 500 MG tablet; Take 1 tablet (500 mg total) by mouth 2 (two) times daily.  Dispense: 60 tablet; Refill: 6  Preterm labor symptoms and general obstetric precautions including but not limited to vaginal bleeding, contractions, leaking of fluid and fetal movement were reviewed in detail with the patient. Please refer to After Visit Summary for other counseling recommendations.  Return in about 1 week (around 01/25/2018) for weekly as scheduled, OB visit.   Hermina StaggersErvin, Vickee Mormino L, MD

## 2018-01-18 NOTE — Progress Notes (Signed)
US for growth and BPP done today 

## 2018-01-19 ENCOUNTER — Encounter: Payer: Self-pay | Admitting: *Deleted

## 2018-01-23 ENCOUNTER — Telehealth: Payer: Self-pay | Admitting: *Deleted

## 2018-01-23 NOTE — Telephone Encounter (Signed)
Called patient & she states she was having contractions on Sunday and went to the hospital. Patient states they found her to be dehydrated and gave her fluids. Patient states after the fluids her contractions went away & everything with the baby was fine on ultrasound. Patient states she just wanted to call and let us know. Told patient I will make a note in her chart & to remind Dr Debroah LoopArnold on Thursday at her appt. Patient verbalized understanding and had no questions

## 2018-01-23 NOTE — Telephone Encounter (Signed)
Patient called, left message. Was seen in [facility], labor and delivery on 1/27 and released later that same day. Was treated for "severe dehydration and having contractions". Has appt on 01/25/18. Would like a return call.

## 2018-01-25 ENCOUNTER — Ambulatory Visit: Payer: Self-pay

## 2018-01-25 ENCOUNTER — Ambulatory Visit (INDEPENDENT_AMBULATORY_CARE_PROVIDER_SITE_OTHER): Payer: BLUE CROSS/BLUE SHIELD | Admitting: Obstetrics & Gynecology

## 2018-01-25 ENCOUNTER — Ambulatory Visit: Payer: BLUE CROSS/BLUE SHIELD | Admitting: *Deleted

## 2018-01-25 VITALS — BP 141/65 | HR 61 | Wt >= 6400 oz

## 2018-01-25 DIAGNOSIS — O10919 Unspecified pre-existing hypertension complicating pregnancy, unspecified trimester: Secondary | ICD-10-CM

## 2018-01-25 DIAGNOSIS — O24415 Gestational diabetes mellitus in pregnancy, controlled by oral hypoglycemic drugs: Secondary | ICD-10-CM

## 2018-01-25 DIAGNOSIS — O0990 Supervision of high risk pregnancy, unspecified, unspecified trimester: Secondary | ICD-10-CM

## 2018-01-25 LAB — POCT URINALYSIS DIP (DEVICE)
GLUCOSE, UA: NEGATIVE mg/dL
Ketones, ur: 80 mg/dL — AB
NITRITE: NEGATIVE
Protein, ur: 30 mg/dL — AB
Specific Gravity, Urine: 1.025 (ref 1.005–1.030)
Urobilinogen, UA: 1 mg/dL (ref 0.0–1.0)
pH: 6.5 (ref 5.0–8.0)

## 2018-01-25 NOTE — Progress Notes (Signed)
   PRENATAL VISIT NOTE  Subjective:  Jamie Taylor is a 32 y.o. G2P1001 at 7859w4d being seen today for ongoing prenatal care.  She is currently monitored for the following issues for this high-risk pregnancy and has History of pre-eclampsia; Supervision of high risk pregnancy, antepartum; Morbid obesity (HCC); History of shingles; GDM (gestational diabetes mellitus); Chronic hypertension during pregnancy, antepartum; False positive syphilis serology; and History of herpes genitalis on their problem list.  Patient reports no complaints.  Contractions: Irregular. Vag. Bleeding: None.  Movement: Present. Denies leaking of fluid.   The following portions of the patient's history were reviewed and updated as appropriate: allergies, current medications, past family history, past medical history, past social history, past surgical history and problem list. Problem list updated.  Objective:   Vitals:   01/25/18 0821  BP: (!) 123/38  Pulse: 61  Weight: (!) 417 lb (189.1 kg)    Fetal Status: Fetal Heart Rate (bpm): NST   Movement: Present     General:  Alert, oriented and cooperative. Patient is in no acute distress.  Skin: Skin is warm and dry. No rash noted.   Cardiovascular: Normal heart rate noted  Respiratory: Normal respiratory effort, no problems with respiration noted  Abdomen: Soft, gravid, appropriate for gestational age.  Pain/Pressure: Present     Pelvic: Cervical exam deferred        Extremities: Normal range of motion.  Edema: Trace  Mental Status:  Normal mood and affect. Normal behavior. Normal judgment and thought content.   Assessment and Plan:  Pregnancy: G2P1001 at 4759w4d  1. Supervision of high risk pregnancy, antepartum Adequate fetal surveillance today  2. Gestational diabetes mellitus (GDM) in third trimester controlled on oral hypoglycemic drug States values are stable and will bring record to next appt  3. Chronic hypertension during pregnancy, antepartum BP  is stable and no sx  Preterm labor symptoms and general obstetric precautions including but not limited to vaginal bleeding, contractions, leaking of fluid and fetal movement were reviewed in detail with the patient. Please refer to After Visit Summary for other counseling recommendations.  Return in about 7 days (around 02/01/2018) for weekly as scheduled.   Scheryl DarterJames Arnold, MD

## 2018-01-25 NOTE — Progress Notes (Signed)

## 2018-01-25 NOTE — Progress Notes (Signed)
Pt states she had frequent, painful UC's on 1/27.  She received [place] for [place] @ [place] and UC's stopped. She was told cervix was dilated 1cm.  Pt has not started Valtrex yet due to finances. She will obtain Valtrex Rx tomorrow.  Pt denies H/A or visual disturbances.

## 2018-01-25 NOTE — Patient Instructions (Signed)

## 2018-01-29 ENCOUNTER — Telehealth: Payer: Self-pay | Admitting: *Deleted

## 2018-01-29 NOTE — Telephone Encounter (Signed)
Ladean RayaConstance called and left a voicemail this am stating she has a lot of swelling in her feet to the point she can't bend her ankles. States she feels like her blood pressure is changing, is having vomiting and dizziness. Wants to know if she should come in. I called Ladean RayaConstance and she states she is not having dizziness right now but was off and on over the weekend. Stated no vomiting now and swelling still a lot , but can move ankles.  She denies visual changes or headaches. States the edema came on suddenly on Friday 01/26/18.  I instructed her to come in for evaluation to [facility].   She states she wanted to be sure because she lives an hour away and has to be back for her child and child has a dental appointment 1200 tomorrow that she has to be present for. I looked at office appointments for today and tomorrow. Patient does not want to see Dr. Debroah LoopArnold tomorrow and I have no openings. We disucssed since she is high risk , has CHTN and Hx pre-eclampsia that her symptoms are concerning and that pre- eclampsia can become ecclampsia and be life threatening for her and her child. I advised her to come to [facility] for evaluation as soon as she can and sooner if her symptoms worsen. She voices understanding.

## 2018-01-30 ENCOUNTER — Inpatient Hospital Stay (HOSPITAL_COMMUNITY)
Admission: AD | Admit: 2018-01-30 | Discharge: 2018-01-30 | Disposition: A | Discharge status: Home / Self Care | Payer: BLUE CROSS/BLUE SHIELD | Source: Ambulatory Visit | Attending: Obstetrics and Gynecology | Admitting: Obstetrics and Gynecology

## 2018-01-30 ENCOUNTER — Encounter (HOSPITAL_COMMUNITY): Payer: Self-pay

## 2018-01-30 ENCOUNTER — Other Ambulatory Visit: Payer: Self-pay

## 2018-01-30 DIAGNOSIS — O163 Unspecified maternal hypertension, third trimester: Secondary | ICD-10-CM | POA: Insufficient documentation

## 2018-01-30 DIAGNOSIS — O1203 Gestational edema, third trimester: Secondary | ICD-10-CM | POA: Diagnosis not present

## 2018-01-30 DIAGNOSIS — O10919 Unspecified pre-existing hypertension complicating pregnancy, unspecified trimester: Secondary | ICD-10-CM

## 2018-01-30 DIAGNOSIS — Z7982 Long term (current) use of aspirin: Secondary | ICD-10-CM | POA: Diagnosis not present

## 2018-01-30 DIAGNOSIS — R609 Edema, unspecified: Secondary | ICD-10-CM | POA: Diagnosis not present

## 2018-01-30 DIAGNOSIS — O10913 Unspecified pre-existing hypertension complicating pregnancy, third trimester: Secondary | ICD-10-CM | POA: Diagnosis not present

## 2018-01-30 DIAGNOSIS — O26893 Other specified pregnancy related conditions, third trimester: Secondary | ICD-10-CM | POA: Diagnosis not present

## 2018-01-30 DIAGNOSIS — Z3A36 36 weeks gestation of pregnancy: Secondary | ICD-10-CM | POA: Diagnosis not present

## 2018-01-30 LAB — COMPREHENSIVE METABOLIC PANEL
ALBUMIN: 2.7 g/dL — AB (ref 3.5–5.0)
ALT: 15 U/L (ref 14–54)
AST: 17 U/L (ref 15–41)
Alkaline Phosphatase: 74 U/L (ref 38–126)
Anion gap: 8 (ref 5–15)
BILIRUBIN TOTAL: 0.6 mg/dL (ref 0.3–1.2)
BUN: 9 mg/dL (ref 6–20)
CHLORIDE: 109 mmol/L (ref 101–111)
CO2: 18 mmol/L — ABNORMAL LOW (ref 22–32)
CREATININE: 0.58 mg/dL (ref 0.44–1.00)
Calcium: 8 mg/dL — ABNORMAL LOW (ref 8.9–10.3)
GFR calc Af Amer: >60 mL/min (ref >60)
GLUCOSE: 92 mg/dL (ref 65–99)
POTASSIUM: 3.2 mmol/L — AB (ref 3.5–5.1)
Sodium: 135 mmol/L (ref 135–145)
Total Protein: 6.1 g/dL — ABNORMAL LOW (ref 6.5–8.1)

## 2018-01-30 LAB — URINALYSIS, ROUTINE W REFLEX MICROSCOPIC
BILIRUBIN URINE: NEGATIVE
GLUCOSE, UA: NEGATIVE mg/dL
HGB URINE DIPSTICK: NEGATIVE
KETONES UR: NEGATIVE mg/dL
Leukocytes, UA: NEGATIVE
NITRITE: NEGATIVE
PROTEIN: 30 mg/dL — AB
Specific Gravity, Urine: 1.027 (ref 1.005–1.030)
pH: 6 (ref 5.0–8.0)

## 2018-01-30 LAB — CBC
HCT: 32.2 % — ABNORMAL LOW (ref 36.0–46.0)
Hemoglobin: 11.3 g/dL — ABNORMAL LOW (ref 12.0–15.0)
MCH: 31.1 pg (ref 26.0–34.0)
MCHC: 35.1 g/dL (ref 30.0–36.0)
MCV: 88.7 fL (ref 78.0–100.0)
Platelets: 253 10*3/uL (ref 150–400)
RBC: 3.63 MIL/uL — AB (ref 3.87–5.11)
RDW: 12.9 % (ref 11.5–15.5)
WBC: 11.1 10*3/uL — AB (ref 4.0–10.5)

## 2018-01-30 LAB — PROTEIN / CREATININE RATIO, URINE
Creatinine, Urine: 335 mg/dL
Protein Creatinine Ratio: 0.07 mg/mg{Cre} (ref 0.00–0.15)
Total Protein, Urine: 23 mg/dL

## 2018-01-30 NOTE — MAU Provider Note (Signed)
History     CSN: [id number]  Arrival date and time: 01/30/18 1733   First Provider Initiated Contact with Patient 01/30/18 1931      No chief complaint on file.  HPI   [name] is a 32 y.o. female G2P1001 @ 64w2dhere in MAU with concerns about her BP. States she has chronic HTN and over the weekend she had N/V and increase swelling in her legs. States she called the office and was told to come into MAU for evaluation. She denies Ha, epigastric pain or scotoma. + fetal movement and No bleeding.   OB History    Gravida Para Term Preterm AB Living   2 1 1     1    SAB TAB Ectopic Multiple Live Births                  Past Medical History:  Diagnosis Date  . Gestational diabetes   . Pregnancy induced hypertension     Past Surgical History:  Procedure Laterality Date  . TONSILLECTOMY      History reviewed. No pertinent family history.  Social History   Tobacco Use  . Smoking status: Never Smoker  . Smokeless tobacco: Never Used  Substance Use Topics  . Alcohol use: No  . Drug use: No    Allergies: No Known Allergies  Medications Prior to Admission  Medication Sig Dispense Refill Last Dose  . acetaminophen (TYLENOL) 500 MG tablet Take 500 mg by mouth every 6 (six) hours as needed for mild pain.   Past Week at Unknown time  . aspirin EC 81 MG tablet Take 1 tablet (81 mg total) by mouth daily. 90 tablet 3 01/29/2018 at Unknown time  . glyBURIDE (DIABETA) 2.5 MG tablet Take 1 tablet (2.5 mg total) by mouth at bedtime. 60 tablet 3 01/29/2018 at Unknown time  . Prenatal Vit-Fe Fumarate-FA (PRENATAL VITAMINS) 28-0.8 MG TABS Take one tablet daily. 30 tablet 9 01/29/2018 at Unknown time  . triamcinolone ointment (KENALOG) 0.5 % Apply twice a day for a week then once a day for a week. 30 g 0 Past Month at Unknown time  . valACYclovir (VALTREX) 500 MG tablet Take 1 tablet (500 mg total) by mouth 2 (two) times daily. 60 tablet 6 01/29/2018 at Unknown time   Results for  orders placed or performed during the hospital encounter of 01/30/18 (from the past 48 hour(s))  Protein / creatinine ratio, urine     Status: None   Collection Time: 01/30/18  6:00 PM  Result Value Ref Range   Creatinine, Urine 335.00 mg/dL   Total Protein, Urine 23 mg/dL    Comment: NO NORMAL RANGE ESTABLISHED FOR THIS TEST   Protein Creatinine Ratio 0.07 0.00 - 0.15 mg/mg[Cre]    Comment: [facility]formed [facility] [address] [place]  [id number] Urinalysis, Routine w reflex microscopic     Status: Abnormal   Collection Time: 01/30/18  6:00 PM  Result Value Ref Range   Color, Urine YELLOW YELLOW   APPearance HAZY (A) CLEAR   Specific Gravity, Urine 1.027 1.005 - 1.030   pH 6.0 5.0 - 8.0   Glucose, UA NEGATIVE NEGATIVE mg/dL   Hgb urine dipstick NEGATIVE NEGATIVE   Bilirubin Urine NEGATIVE NEGATIVE   Ketones, ur NEGATIVE NEGATIVE mg/dL   Protein, ur 30 (A) NEGATIVE mg/dL   Nitrite NEGATIVE NEGATIVE   Leukocytes, UA NEGATIVE NEGATIVE   RBC / HPF 0-5 0 - 5 RBC/hpf   WBC, UA  0-5 0 - 5 WBC/hpf   Bacteria, UA RARE (A) NONE SEEN   Squamous Epithelial / LPF 0-5 (A) NONE SEEN   Mucus PRESENT     Comment: Performed at [facility], [address], [place], [place] [address]  CBC     Status: Abnormal   Collection Time: 01/30/18  6:05 PM  Result Value Ref Range   WBC 11.1 (H) 4.0 - 10.5 K/uL   RBC 3.63 (L) 3.87 - 5.11 MIL/uL   Hemoglobin 11.3 (L) 12.0 - 15.0 g/dL   HCT 32.2 (L) 36.0 - 46.0 %   MCV 88.7 78.0 - 100.0 fL   MCH 31.1 26.0 - 34.0 pg   MCHC 35.1 30.0 - 36.0 g/dL   RDW 12.9 11.5 - 15.5 %   Platelets 253 150 - 400 K/uL    Comment: Performed at [facility], [address], [place], [place] [address]  Comprehensive metabolic panel     Status: Abnormal   Collection Time: 01/30/18  6:05 PM  Result Value Ref Range   Sodium 135 135 - 145 mmol/L   Potassium 3.2 (L) 3.5 - 5.1 mmol/L   Chloride 109 101 - 111 mmol/L   CO2 18 (L) 22 - 32 mmol/L   Glucose,  Bld 92 65 - 99 mg/dL   BUN 9 6 - 20 mg/dL   Creatinine, Ser 0.58 0.44 - 1.00 mg/dL   Calcium 8.0 (L) 8.9 - 10.3 mg/dL   Total Protein 6.1 (L) 6.5 - 8.1 g/dL   Albumin 2.7 (L) 3.5 - 5.0 g/dL   AST 17 15 - 41 U/L   ALT 15 14 - 54 U/L   Alkaline Phosphatase 74 38 - 126 U/L   Total Bilirubin 0.6 0.3 - 1.2 mg/dL   GFR calc non Af Amer >60 >60 mL/min   GFR calc Af Amer >60 >60 mL/min    Comment: (NOTE) The eGFR has been calculated using the CKD EPI equation. This calculation has not been validated in all clinical situations. eGFR's persistently <60 mL/min signify possible Chronic Kidney Disease.    Anion gap 8 5 - 15    Comment: Performed at [facility], [address], [place], [place] [address]    Review of Systems  Constitutional: Negative for fever.  Eyes: Negative for photophobia, redness and visual disturbance.  Gastrointestinal: Negative for abdominal pain.  Neurological: Negative for dizziness and headaches.   Physical Exam   Blood pressure 134/67, pulse (!) 58, temperature 98.8 F (37.1 C), temperature source Oral, resp. rate 20, weight (!) 422 lb 4 oz (191.5 kg), SpO2 98 %.  Patient Vitals for the past 24 hrs:  BP Temp Temp src Pulse Resp SpO2 Weight  01/30/18 1944 140/67 - - (!) 58 - - -  01/30/18 1931 140/67 - - (!) 58 - - -  01/30/18 1916 (!) 126/59 - - (!) 56 - - -  01/30/18 1900 134/67 - - (!) 58 - - -  01/30/18 1848 127/68 - - (!) 55 - - -  01/30/18 1837 (!) 123/57 - - (!) 57 - - -  01/30/18 1822 (!) 162/64 - - (!) 58 - - -  01/30/18 1821 (!) 162/64 - - (!) 58 - - -  01/30/18 1800 (!) 158/81 - - 61 - - -  01/30/18 1754 (!) 164/86 98.8 F (37.1 C) Oral 62 20 98 % (!) 422 lb 4 oz (191.5 kg)  01/30/18 1753 - - - - - 98 % -  Physical Exam  Constitutional: She is oriented to person, place, and time. She appears well-developed and well-nourished. No distress.  Respiratory: Effort normal.  Genitourinary:  Genitourinary Comments: Cervix: 0.5cm, thick,  anterior.   Musculoskeletal: Normal range of motion.  Neurological: She is alert and oriented to person, place, and time. She has normal reflexes. She displays normal reflexes.  Negative clonus   Skin: Skin is warm. She is not diaphoretic.  Psychiatric: Her behavior is normal.   Fetal Tracing: Baseline: 135 bpm Variability: Moderate  Accelerations: 15x15 Decelerations: none ? Variable @ 1904 Toco: None  MAU Course  Procedures  None  MDM  Initial BP's in MAU show severe range pressures. Patient is morbidly obese; discussed BP technique with RN. Per the RN she took the BP on the patient's right wrist, Per the Patient she normally has her BP taken on the left forearm with her arm at heart level. Asked RN to collect BP with this technique and BP readings more consistent with what they have been.  Preeclampsia workup NST UA Discussed BP readings and labs with Dr. Nehemiah Settle, patient has close follow up in the office on Thursday.  Assessment and Plan   A:  1. Chronic hypertension during pregnancy, antepartum   2. Edema during pregnancy in third trimester     P:  Discharge home with strict return precautions Preeclampsia precautions  Keep your appointment in the office on Thursday for NST and BP check Alternate elevation and activity  Recommend ted hose  Return to MAU if symptoms worsen  Chauntae Hults, Artist Pais, NP 01/30/2018 8:03 PM

## 2018-01-30 NOTE — Discharge Instructions (Signed)

## 2018-01-30 NOTE — MAU Note (Signed)
Has been having a lot more swelling, burning in ankles.  BP is up and down, can feel the changes.  Was having some contractions yesterday.  Was vomiting yesterday.  Called and was to have come in, but was having car issues.  Denies headache at this time, no epigastric pain or visual changes.

## 2018-01-31 IMAGING — US US MFM OB FOLLOW-UP
1 series · 14 of 28 positions shown · non-contrast
Comparison: none

[Series 1: us mfm ob follow-up · 69 acquisitions, 14 frames shown]
[im 3/69]
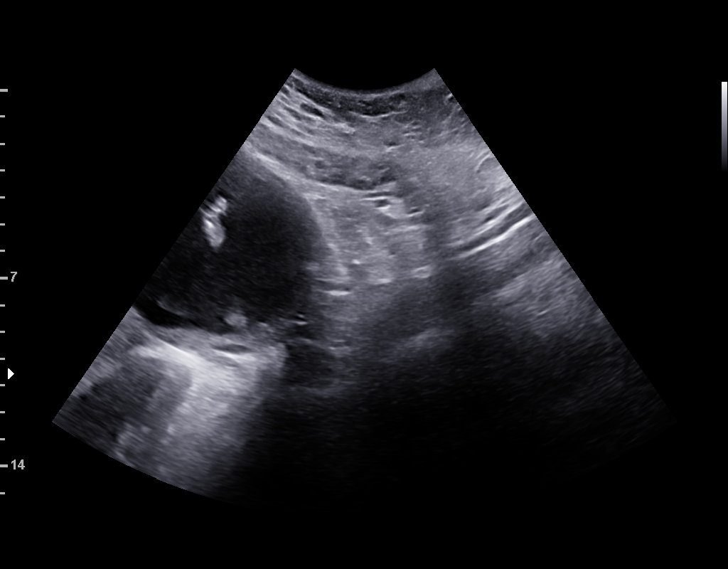
[im 8/69]
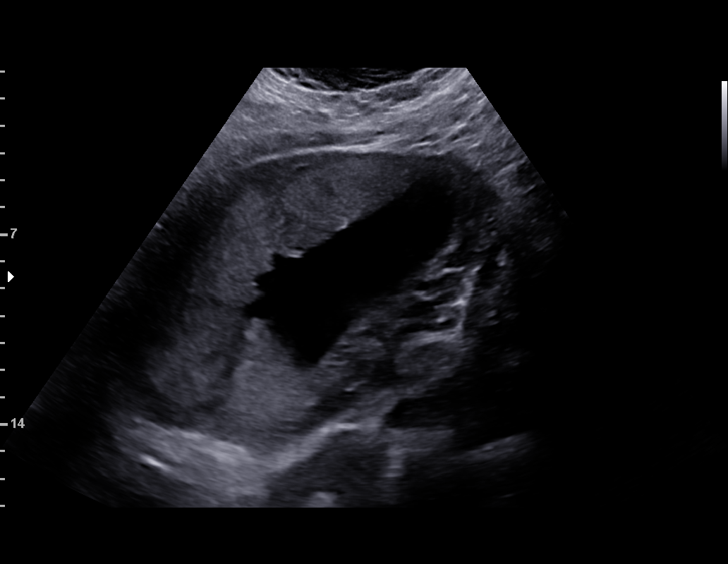
[im 13/69]
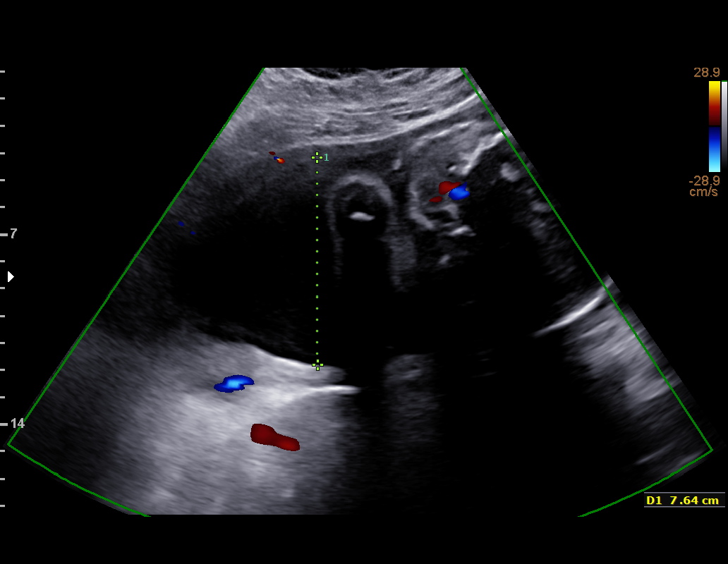
[im 18/69]
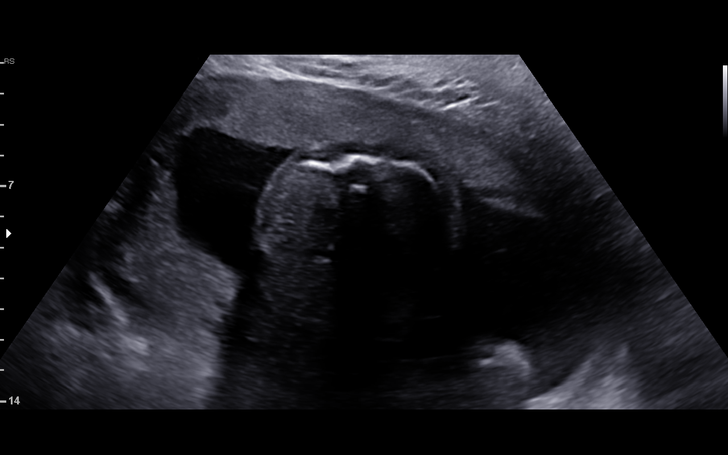
[im 23/69]
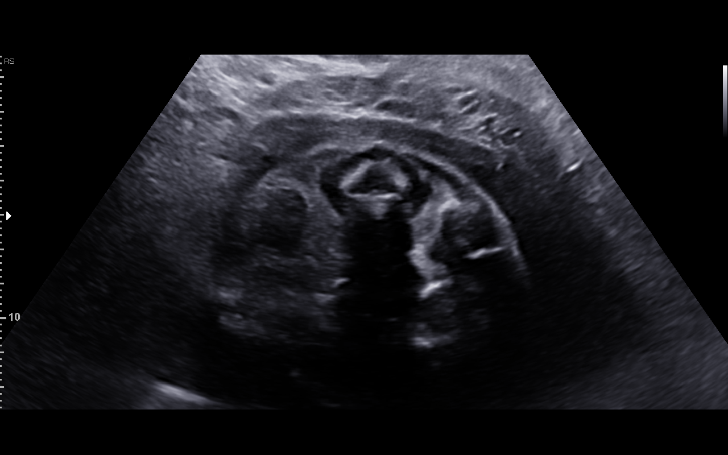
[im 28/69]
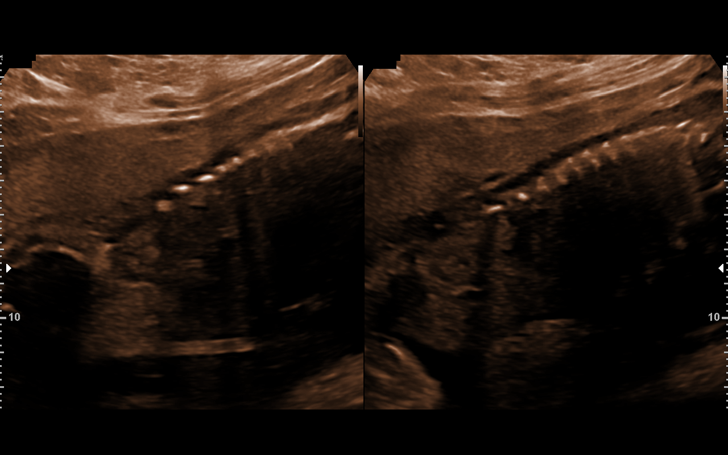
[im 33/69]
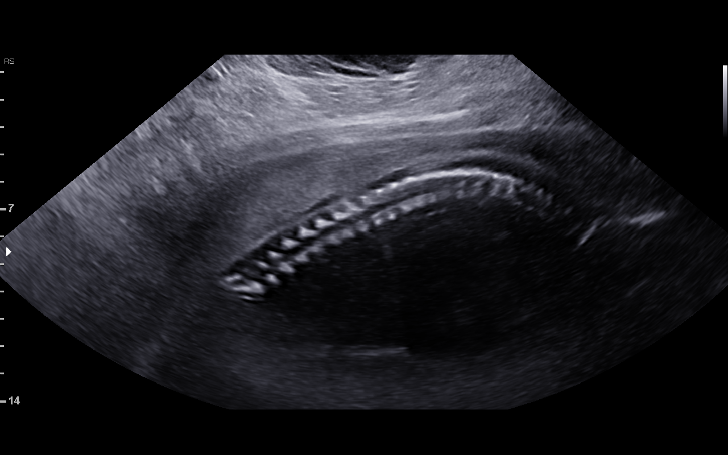
[im 38/69]
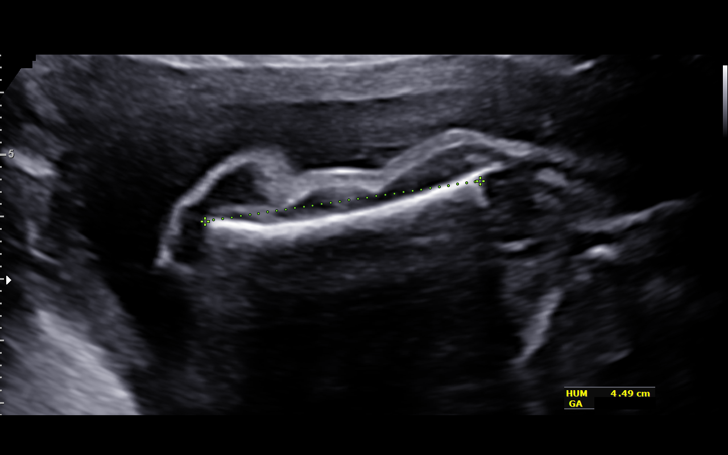
[im 43/69]
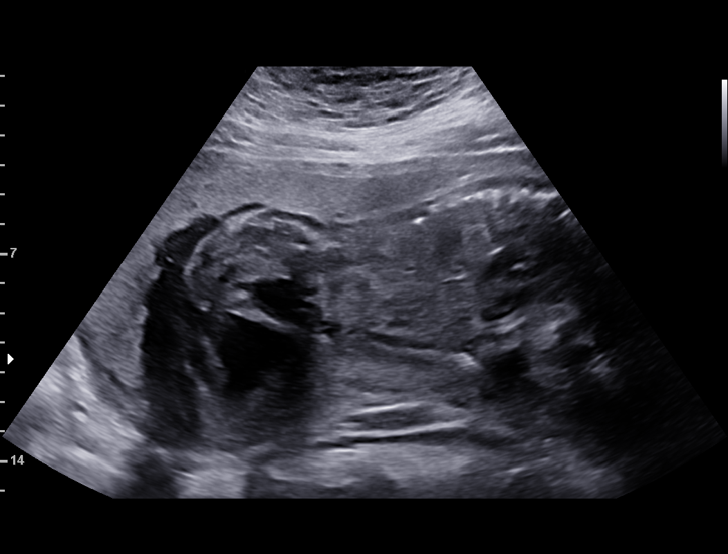
[im 48/69]
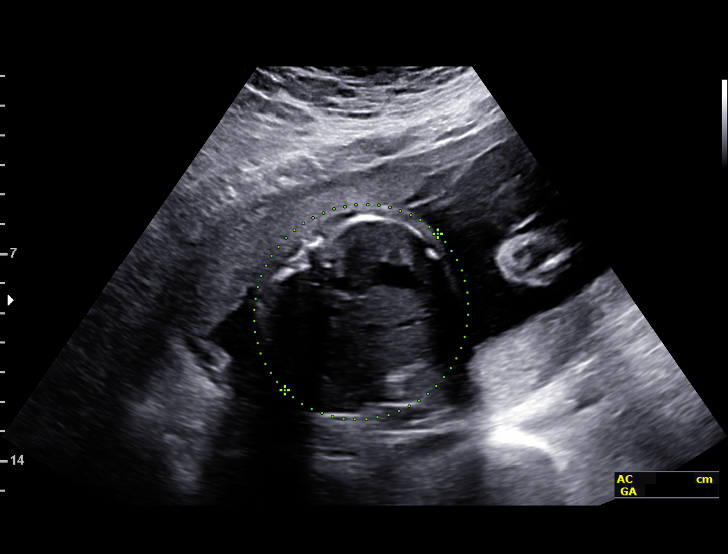
[im 53/69]
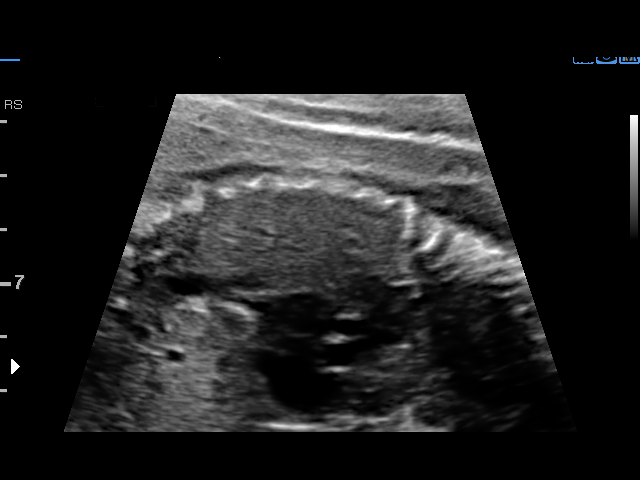
[im 58/69]
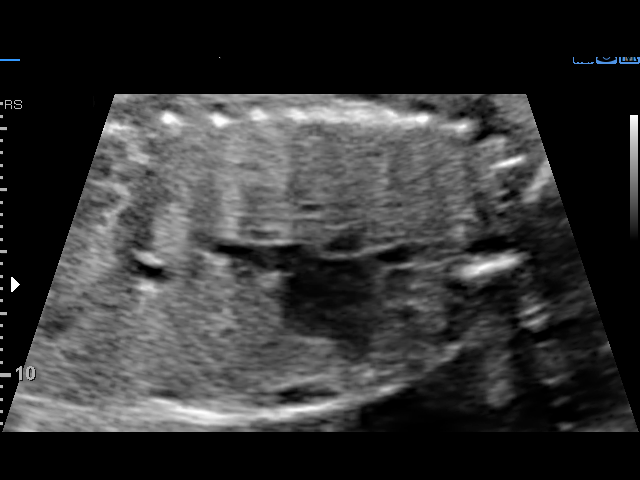
[im 63/69]
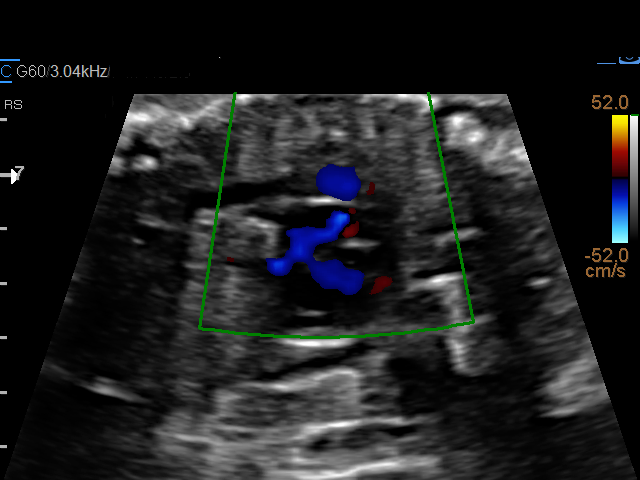
[im 69/69]
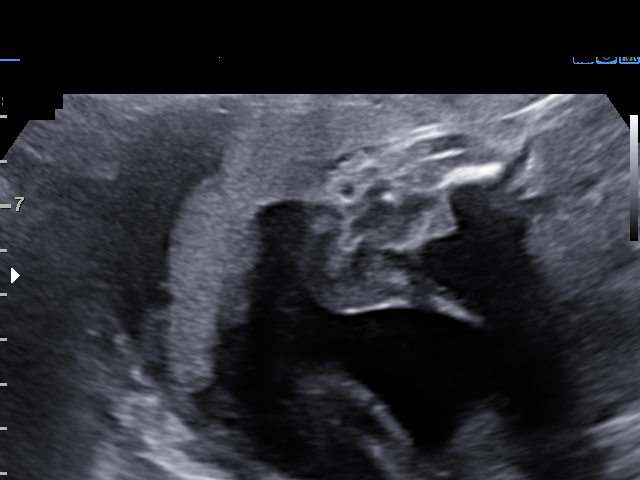

[14 of 28 positions shown; findings below may reference images not displayed]

1  ERIC-PARFAIT ANDULA           [id number]      [id number]     [id number]
Indications

26 weeks gestation of pregnancy
Encounter for other antenatal screening
follow-up
Evaluate anatomy not seen on prior
sonogram
Maternal morbid obesity
Poor obstetric history: Previous
preeclampsia / eclampsia/gestational HTN-
ASA
Gestational diabetes in pregnancy,
controlled by oral hypoglycemic drugs
(Glyburide)
OB History

Blood Type:            Height:  5'10"  Weight (lb):  399       BMI:
Gravidity:    2         Term:   1        Prem:   0        SAB:   0
TOP:          0       Ectopic:  0        Living: 1
Fetal Evaluation

Num Of Fetuses:     1
Fetal Heart         145
Rate(bpm):
Cardiac Activity:   Observed
Presentation:       Cephalic
Placenta:           Anterior, above cervical os
P. Cord Insertion:  Previously Visualized
Amniotic Fluid
AFI FV:      Subjectively within normal limits

Largest Pocket(cm)
7.64
Biometry

BPD:      66.2  mm     G. Age:  26w 5d         49  %    CI:        71.25   %    70 - 86
FL/HC:      21.5   %    18.6 -
HC:      249.8  mm     G. Age:  27w 1d         48  %    HC/AC:      1.12        1.04 -
AC:      223.4  mm     G. Age:  26w 5d         52  %    FL/BPD:     81.0   %    71 - 87
FL:       53.6  mm     G. Age:  28w 3d         88  %    FL/AC:      24.0   %    20 - 24
HUM:        45  mm     G. Age:  26w 5d         52  %

Est. FW:    6816  gm      2 lb 6 oz     73  %
Gestational Age

U/S Today:     27w 2d                                        EDD:   02/19/18
Best:          26w 3d     Det. By:  Early Ultrasound         EDD:   02/25/18
(08/21/17)
Anatomy

Cranium:               Appears normal         Aortic Arch:            Appears normal
Cavum:                 Previously seen        Ductal Arch:            Appears normal
Ventricles:            Previously seen        Diaphragm:              Not well visualized
Choroid Plexus:        Previously seen        Stomach:                Appears normal, left
sided
Cerebellum:            Previously seen        Abdomen:                Appears normal
Posterior Fossa:       Previously seen        Abdominal Wall:         Previously seen
Nuchal Fold:           Not applicable (>20    Cord Vessels:           Previously seen
wks GA)
Face:                  Orbits nl; profile not Kidneys:                Appear normal
well visualized
Lips:                  Not well visualized    Bladder:                Appears normal
Thoracic:              Appears normal         Spine:                  Not well visualized
Heart:                 Not well visualized    Upper Extremities:      Previously seen
RVOT:                  Not well visualized    Lower Extremities:      Previously seen
LVOT:                  Not well visualized

Other:  Fetus appears to be a female. Technically difficult due to maternal
habitus and fetal position.
Cervix Uterus Adnexa

Cervix
Length:           3.34  cm.
Normal appearance by transabdominal scan.

Uterus
No abnormality visualized.

Left Ovary
Not visualized.

Right Ovary
Not visualized.

Adnexa:       No abnormality visualized. No adnexal mass
visualized.
Impression

SIUP at 24w3d, O56TK, obesity
active fetus
EFW 73rd%'le
no structural defects demonstrated
views remain limited owing to habitus
no previa
Recommendations

Recommend interval growth in 4 weeks by ultrasound.

## 2018-02-01 ENCOUNTER — Encounter: Payer: Self-pay | Admitting: *Deleted

## 2018-02-01 ENCOUNTER — Ambulatory Visit (INDEPENDENT_AMBULATORY_CARE_PROVIDER_SITE_OTHER): Payer: BLUE CROSS/BLUE SHIELD | Admitting: Obstetrics and Gynecology

## 2018-02-01 ENCOUNTER — Encounter: Payer: Self-pay | Admitting: Obstetrics and Gynecology

## 2018-02-01 ENCOUNTER — Ambulatory Visit (INDEPENDENT_AMBULATORY_CARE_PROVIDER_SITE_OTHER): Payer: BLUE CROSS/BLUE SHIELD | Admitting: *Deleted

## 2018-02-01 ENCOUNTER — Ambulatory Visit: Payer: Self-pay

## 2018-02-01 VITALS — BP 136/74 | HR 53 | Wt >= 6400 oz

## 2018-02-01 DIAGNOSIS — Z113 Encounter for screening for infections with a predominantly sexual mode of transmission: Secondary | ICD-10-CM | POA: Diagnosis not present

## 2018-02-01 DIAGNOSIS — O24415 Gestational diabetes mellitus in pregnancy, controlled by oral hypoglycemic drugs: Secondary | ICD-10-CM

## 2018-02-01 DIAGNOSIS — O10919 Unspecified pre-existing hypertension complicating pregnancy, unspecified trimester: Secondary | ICD-10-CM

## 2018-02-01 DIAGNOSIS — O99210 Obesity complicating pregnancy, unspecified trimester: Secondary | ICD-10-CM | POA: Insufficient documentation

## 2018-02-01 DIAGNOSIS — O0990 Supervision of high risk pregnancy, unspecified, unspecified trimester: Secondary | ICD-10-CM

## 2018-02-01 LAB — POCT URINALYSIS DIP (DEVICE)
Glucose, UA: NEGATIVE mg/dL
HGB URINE DIPSTICK: NEGATIVE
Ketones, ur: 15 mg/dL — AB
NITRITE: NEGATIVE
PH: 6 (ref 5.0–8.0)
Protein, ur: 30 mg/dL — AB
Specific Gravity, Urine: >=1.03 (ref 1.005–1.030)
Urobilinogen, UA: 1 mg/dL (ref 0.0–1.0)

## 2018-02-01 LAB — OB RESULTS CONSOLE GBS: STREP GROUP B AG: POSITIVE

## 2018-02-01 NOTE — Progress Notes (Signed)
Pt states she had evaluation @ MAU on 2/5 due to increased swelling and elevated BP. She reports having H/A today and occasional dizziness - none today.

## 2018-02-01 NOTE — Progress Notes (Signed)

## 2018-02-01 NOTE — Progress Notes (Signed)
Prenatal Visit Note Date: 02/01/2018 Clinic: [facility]  Subjective:  Jamie Taylor is a 32 y.o. G2P1001 at 5364w4d being seen today for ongoing prenatal care.  She is currently monitored for the following issues for this high-risk pregnancy and has History of pre-eclampsia; Supervision of high risk pregnancy, antepartum; BMI 60.0-69.9, adult (HCC); History of shingles; GDM (gestational diabetes mellitus); Chronic hypertension during pregnancy, antepartum; False positive syphilis serology; History of herpes genitalis; and Obesity in pregnancy on their problem list.  Patient reports low back and front low belly discomfort. feet and hands swelling. harder to move around, occasional HAs and hard to sleep at times..   Contractions: Irregular. Vag. Bleeding: None.  Movement: Present. Denies leaking of fluid.   The following portions of the patient's history were reviewed and updated as appropriate: allergies, current medications, past family history, past medical history, past social history, past surgical history and problem list. Problem list updated.  Objective:   Vitals:   02/01/18 0846  BP: 136/74  Pulse: (!) 53  Weight: (!) 419 lb 3.2 oz (190.1 kg)    Fetal Status: Fetal Heart Rate (bpm): NST   Movement: Present     General:  Alert, oriented and cooperative. Patient is in no acute distress.  Skin: Skin is warm and dry. No rash noted.   Cardiovascular: Normal heart rate noted  Respiratory: Normal respiratory effort, no problems with respiration noted  Abdomen: Soft, gravid, appropriate for gestational age. Pain/Pressure: Present     Pelvic:  Cervical exam deferred        Extremities: Normal range of motion.     Mental Status: Normal mood and affect. Normal behavior. Normal judgment and thought content.   Urinalysis:      Assessment and Plan:  Pregnancy: G2P1001 at 3064w4d  1. Supervision of high risk pregnancy, antepartum Routine care. Pt wanting to know if  can be out of work but medically, she is doing well, and doesn't have an indication to be out of work. Compression stockings and symptomatic measures d/w her.  - GC/Chlamydia probe amp ([facility] - Strep Gp B NAA  2. Gestational diabetes mellitus (GDM) in third trimester controlled on oral hypoglycemic drug Normal BS log on glyburide 2.5 qhs.   3. Chronic hypertension during pregnancy, antepartum Doing well on no meds. bpp 10/10, moderate variability, cephalic. Normal 1/24 efw and growth.   Preterm labor symptoms and general obstetric precautions including but not limited to vaginal bleeding, contractions, leaking of fluid and fetal movement were reviewed in detail with the patient. Please refer to After Visit Summary for other counseling recommendations.  Return in about 7 days (around 02/08/2018) for weekly as scheduled.   [place] BingPickens, Rashel Okeefe, MD

## 2018-02-02 LAB — GC/CHLAMYDIA PROBE AMP (~~LOC~~) NOT AT ARMC
CHLAMYDIA, DNA PROBE: NEGATIVE
NEISSERIA GONORRHEA: NEGATIVE

## 2018-02-03 LAB — STREP GP B NAA: STREP GROUP B AG: POSITIVE — AB

## 2018-02-05 ENCOUNTER — Encounter: Payer: Self-pay | Admitting: Obstetrics and Gynecology

## 2018-02-05 DIAGNOSIS — O99820 Streptococcus B carrier state complicating pregnancy: Secondary | ICD-10-CM | POA: Insufficient documentation

## 2018-02-08 ENCOUNTER — Ambulatory Visit (INDEPENDENT_AMBULATORY_CARE_PROVIDER_SITE_OTHER): Payer: BLUE CROSS/BLUE SHIELD | Admitting: Obstetrics & Gynecology

## 2018-02-08 ENCOUNTER — Encounter (HOSPITAL_COMMUNITY): Payer: Self-pay | Admitting: *Deleted

## 2018-02-08 ENCOUNTER — Telehealth (HOSPITAL_COMMUNITY): Payer: Self-pay | Admitting: *Deleted

## 2018-02-08 ENCOUNTER — Ambulatory Visit: Payer: Self-pay

## 2018-02-08 ENCOUNTER — Ambulatory Visit (INDEPENDENT_AMBULATORY_CARE_PROVIDER_SITE_OTHER): Payer: BLUE CROSS/BLUE SHIELD | Admitting: *Deleted

## 2018-02-08 VITALS — BP 120/67 | HR 57 | Wt >= 6400 oz

## 2018-02-08 DIAGNOSIS — O10919 Unspecified pre-existing hypertension complicating pregnancy, unspecified trimester: Secondary | ICD-10-CM

## 2018-02-08 DIAGNOSIS — O24415 Gestational diabetes mellitus in pregnancy, controlled by oral hypoglycemic drugs: Secondary | ICD-10-CM

## 2018-02-08 DIAGNOSIS — O99820 Streptococcus B carrier state complicating pregnancy: Secondary | ICD-10-CM

## 2018-02-08 DIAGNOSIS — Z6841 Body Mass Index (BMI) 40.0 and over, adult: Secondary | ICD-10-CM

## 2018-02-08 DIAGNOSIS — O0990 Supervision of high risk pregnancy, unspecified, unspecified trimester: Secondary | ICD-10-CM

## 2018-02-08 LAB — POCT URINALYSIS DIP (DEVICE)
GLUCOSE, UA: NEGATIVE mg/dL
Hgb urine dipstick: NEGATIVE
KETONES UR: NEGATIVE mg/dL
NITRITE: NEGATIVE
PH: 6 (ref 5.0–8.0)
Protein, ur: 30 mg/dL — AB
Specific Gravity, Urine: 1.025 (ref 1.005–1.030)
Urobilinogen, UA: 0.2 mg/dL (ref 0.0–1.0)

## 2018-02-08 NOTE — Progress Notes (Signed)
   PRENATAL VISIT NOTE  Subjective:  Jamie Taylor is a 32 y.o. G2P1001 at 2389w4d being seen today for ongoing prenatal care.  She is currently monitored for the following issues for this high-risk pregnancy and has History of pre-eclampsia; Supervision of high risk pregnancy, antepartum; BMI 60.0-69.9, adult (HCC); History of shingles; GDM (gestational diabetes mellitus); Chronic hypertension during pregnancy, antepartum; False positive syphilis serology; History of herpes genitalis; Obesity in pregnancy; and GBS (group B Streptococcus carrier), +RV culture, currently pregnant on their problem list.  Patient reports usual aches and pains of late third trimester.  Contractions: Irregular. Vag. Bleeding: None.  Movement: Present. Denies leaking of fluid.   The following portions of the patient's history were reviewed and updated as appropriate: allergies, current medications, past family history, past medical history, past social history, past surgical history and problem list. Problem list updated.  Objective:   Vitals:   02/08/18 0826  BP: 120/67  Pulse: (!) 57  Weight: (!) 423 lb 9.6 oz (192.1 kg)    Fetal Status: Fetal Heart Rate (bpm): NST   Movement: Present     General:  Alert, oriented and cooperative. Patient is in no acute distress.  Skin: Skin is warm and dry. No rash noted.   Cardiovascular: Normal heart rate noted  Respiratory: Normal respiratory effort, no problems with respiration noted  Abdomen: Soft, gravid, appropriate for gestational age.  Pain/Pressure: Present     Pelvic: Cervical exam performed       closed/50/high  Extremities: Normal range of motion.     Mental Status:  Normal mood and affect. Normal behavior. Normal judgment and thought content.   Assessment and Plan:  Pregnancy: G2P1001 at 3489w4d  1. Supervision of high risk pregnancy, antepartum - continue weekly BPP -IOL at 39 weeks  2. Chronic hypertension during pregnancy, antepartum   3. BMI  60.0-69.9, adult (HCC)   4. Gestational diabetes mellitus (GDM) in third trimester controlled on oral hypoglycemic drug - her recorded sugars are good  5. GBS (group B Streptococcus carrier), +RV culture, currently pregnant - Treat in labor  Preterm labor symptoms and general obstetric precautions including but not limited to vaginal bleeding, contractions, leaking of fluid and fetal movement were reviewed in detail with the patient. Please refer to After Visit Summary for other counseling recommendations.  Return in about 7 days (around 02/15/2018) for as scheduled.   Allie BossierMyra C Dorenda Pfannenstiel, MD

## 2018-02-08 NOTE — Progress Notes (Signed)

## 2018-02-08 NOTE — Telephone Encounter (Signed)
Preadmission screen  

## 2018-02-08 NOTE — Progress Notes (Signed)
Pt states she is having dizziness and vomiting every night between 0100-0300 - concerned that it could be related to Valtrex. Pt also has increased pelvic and back pain.   US for growth and BPP scheduled on 2/21

## 2018-02-14 ENCOUNTER — Encounter (HOSPITAL_COMMUNITY): Payer: Self-pay

## 2018-02-14 ENCOUNTER — Other Ambulatory Visit: Payer: Self-pay | Admitting: Family Medicine

## 2018-02-14 ENCOUNTER — Encounter: Payer: Self-pay | Admitting: *Deleted

## 2018-02-14 ENCOUNTER — Other Ambulatory Visit: Payer: Self-pay | Admitting: Advanced Practice Midwife

## 2018-02-15 ENCOUNTER — Ambulatory Visit (INDEPENDENT_AMBULATORY_CARE_PROVIDER_SITE_OTHER): Payer: BLUE CROSS/BLUE SHIELD | Admitting: *Deleted

## 2018-02-15 ENCOUNTER — Encounter (HOSPITAL_COMMUNITY): Payer: Self-pay

## 2018-02-15 ENCOUNTER — Encounter: Payer: Self-pay | Admitting: Medical

## 2018-02-15 ENCOUNTER — Ambulatory Visit (HOSPITAL_COMMUNITY)
Admission: RE | Admit: 2018-02-15 | Discharge: 2018-02-15 | Disposition: A | Discharge status: Home / Self Care | Payer: BLUE CROSS/BLUE SHIELD | Source: Ambulatory Visit | Attending: Family Medicine | Admitting: Family Medicine

## 2018-02-15 ENCOUNTER — Ambulatory Visit (INDEPENDENT_AMBULATORY_CARE_PROVIDER_SITE_OTHER): Payer: BLUE CROSS/BLUE SHIELD | Admitting: Medical

## 2018-02-15 ENCOUNTER — Other Ambulatory Visit: Payer: Self-pay | Admitting: Family Medicine

## 2018-02-15 VITALS — BP 136/82 | HR 53 | Wt >= 6400 oz

## 2018-02-15 DIAGNOSIS — O99213 Obesity complicating pregnancy, third trimester: Secondary | ICD-10-CM

## 2018-02-15 DIAGNOSIS — Z6841 Body Mass Index (BMI) 40.0 and over, adult: Secondary | ICD-10-CM

## 2018-02-15 DIAGNOSIS — O10913 Unspecified pre-existing hypertension complicating pregnancy, third trimester: Secondary | ICD-10-CM | POA: Insufficient documentation

## 2018-02-15 DIAGNOSIS — O24415 Gestational diabetes mellitus in pregnancy, controlled by oral hypoglycemic drugs: Secondary | ICD-10-CM

## 2018-02-15 DIAGNOSIS — O99210 Obesity complicating pregnancy, unspecified trimester: Secondary | ICD-10-CM | POA: Insufficient documentation

## 2018-02-15 DIAGNOSIS — O0990 Supervision of high risk pregnancy, unspecified, unspecified trimester: Secondary | ICD-10-CM

## 2018-02-15 DIAGNOSIS — Z8759 Personal history of other complications of pregnancy, childbirth and the puerperium: Secondary | ICD-10-CM

## 2018-02-15 DIAGNOSIS — Z3A38 38 weeks gestation of pregnancy: Secondary | ICD-10-CM | POA: Diagnosis not present

## 2018-02-15 DIAGNOSIS — Z8619 Personal history of other infectious and parasitic diseases: Secondary | ICD-10-CM

## 2018-02-15 DIAGNOSIS — O10919 Unspecified pre-existing hypertension complicating pregnancy, unspecified trimester: Secondary | ICD-10-CM

## 2018-02-15 DIAGNOSIS — O99820 Streptococcus B carrier state complicating pregnancy: Secondary | ICD-10-CM

## 2018-02-15 DIAGNOSIS — O0993 Supervision of high risk pregnancy, unspecified, third trimester: Secondary | ICD-10-CM | POA: Insufficient documentation

## 2018-02-15 HISTORY — DX: Morbid (severe) obesity due to excess calories: E66.01

## 2018-02-15 HISTORY — DX: Body Mass Index (BMI) 40.0 and over, adult: Z684

## 2018-02-15 LAB — POCT URINALYSIS DIP (DEVICE)
Glucose, UA: NEGATIVE mg/dL
Hgb urine dipstick: NEGATIVE
KETONES UR: 15 mg/dL — AB
Nitrite: NEGATIVE
PH: 6.5 (ref 5.0–8.0)
PROTEIN: NEGATIVE mg/dL
SPECIFIC GRAVITY, URINE: 1.025 (ref 1.005–1.030)
Urobilinogen, UA: 1 mg/dL (ref 0.0–1.0)

## 2018-02-15 NOTE — Progress Notes (Signed)
   PRENATAL VISIT NOTE  Subjective:  Jamie Taylor is a 32 y.o. G2P1001 at 6246w4d being seen today for ongoing prenatal care.  She is currently monitored for the following issues for this high-risk pregnancy and has History of pre-eclampsia; Supervision of high risk pregnancy, antepartum; BMI 60.0-69.9, adult (HCC); History of shingles; GDM (gestational diabetes mellitus); Chronic hypertension during pregnancy, antepartum; False positive syphilis serology; History of herpes genitalis; Obesity in pregnancy; and GBS (group B Streptococcus carrier), +RV culture, currently pregnant on their problem list.  Patient reports no complaints.  Contractions: Irregular. Vag. Bleeding: None.  Movement: Present. Denies leaking of fluid.   The following portions of the patient's history were reviewed and updated as appropriate: allergies, current medications, past family history, past medical history, past social history, past surgical history and problem list. Problem list updated.  Objective:   Vitals:   02/15/18 0914  BP: 136/82  Pulse: (!) 53  Weight: (!) 421 lb 11.2 oz (191.3 kg)    Fetal Status: Fetal Heart Rate (bpm): NST   Movement: Present     General:  Alert, oriented and cooperative. Patient is in no acute distress.  Skin: Skin is warm and dry. No rash noted.   Cardiovascular: Normal heart rate noted  Respiratory: Normal respiratory effort, no problems with respiration noted  Abdomen: Soft, gravid, appropriate for gestational age.  Pain/Pressure: Present     Pelvic: Cervical exam deferred        Extremities: Normal range of motion.   2+ pitting edema to the mid shin  Mental Status:  Normal mood and affect. Normal behavior. Normal judgment and thought content.   Assessment and Plan:  Pregnancy: G2P1001 at 2646w4d  1. Supervision of high risk pregnancy, antepartum - IOL scheduled for 02/18/18 - Reviewed US and BPP from MFM today  - BPP 8/8  2. Gestational diabetes mellitus (GDM) in  third trimester controlled on oral hypoglycemic drug - Did not bring log book - Reports normal values for fasting and PP CBG  3. Chronic hypertension during pregnancy, antepartum - Normotensive here today, no headache or visual changes, 2+ pitting edema noted  4. History of pre-eclampsia  5. History of herpes genitalis - On valtrex  6. GBS (group B Streptococcus carrier), +RV culture, currently pregnant - Treat in labor. Patient aware  7. BMI 60.0-69.9, adult [facility](HCC)   Term labor symptoms and general obstetric precautions including but not limited to vaginal bleeding, contractions, leaking of fluid and fetal movement were reviewed in detail with the patient. Please refer to After Visit Summary for other counseling recommendations.  Return in about 5 weeks (around 03/22/2018) for PP visit wth MD. IOL scheduled for 02/18/18.    Vonzella NippleJulie Naomy Esham, PA-C

## 2018-02-15 NOTE — Progress Notes (Signed)
US for growth/BPP done today.  IOL scheduled 2/24 @ midnight

## 2018-02-18 ENCOUNTER — Inpatient Hospital Stay (HOSPITAL_COMMUNITY)
Admission: RE | Admit: 2018-02-18 | Discharge: 2018-02-18 | Disposition: A | Discharge status: Home / Self Care | Payer: BLUE CROSS/BLUE SHIELD | Source: Ambulatory Visit | Attending: Obstetrics & Gynecology | Admitting: Obstetrics & Gynecology

## 2018-02-18 ENCOUNTER — Inpatient Hospital Stay (HOSPITAL_COMMUNITY): Payer: BLUE CROSS/BLUE SHIELD | Admitting: Anesthesiology

## 2018-02-18 ENCOUNTER — Other Ambulatory Visit: Payer: Self-pay

## 2018-02-18 ENCOUNTER — Inpatient Hospital Stay (HOSPITAL_COMMUNITY)
Admission: AD | Admit: 2018-02-18 | Discharge: 2018-02-20 | DRG: 806 | Disposition: A | Discharge status: Home / Self Care | Payer: BLUE CROSS/BLUE SHIELD | Source: Ambulatory Visit | Attending: Family Medicine | Admitting: Family Medicine

## 2018-02-18 ENCOUNTER — Encounter (HOSPITAL_COMMUNITY): Payer: Self-pay

## 2018-02-18 DIAGNOSIS — Z3A39 39 weeks gestation of pregnancy: Secondary | ICD-10-CM | POA: Diagnosis not present

## 2018-02-18 DIAGNOSIS — O1092 Unspecified pre-existing hypertension complicating childbirth: Secondary | ICD-10-CM

## 2018-02-18 DIAGNOSIS — O99824 Streptococcus B carrier state complicating childbirth: Secondary | ICD-10-CM | POA: Diagnosis present

## 2018-02-18 DIAGNOSIS — Z6841 Body Mass Index (BMI) 40.0 and over, adult: Secondary | ICD-10-CM

## 2018-02-18 DIAGNOSIS — A6000 Herpesviral infection of urogenital system, unspecified: Secondary | ICD-10-CM | POA: Diagnosis present

## 2018-02-18 DIAGNOSIS — O99214 Obesity complicating childbirth: Secondary | ICD-10-CM | POA: Diagnosis present

## 2018-02-18 DIAGNOSIS — O9832 Other infections with a predominantly sexual mode of transmission complicating childbirth: Secondary | ICD-10-CM | POA: Diagnosis present

## 2018-02-18 DIAGNOSIS — O24429 Gestational diabetes mellitus in childbirth, unspecified control: Secondary | ICD-10-CM

## 2018-02-18 DIAGNOSIS — O1002 Pre-existing essential hypertension complicating childbirth: Secondary | ICD-10-CM | POA: Diagnosis present

## 2018-02-18 DIAGNOSIS — O99820 Streptococcus B carrier state complicating pregnancy: Secondary | ICD-10-CM

## 2018-02-18 DIAGNOSIS — O0990 Supervision of high risk pregnancy, unspecified, unspecified trimester: Secondary | ICD-10-CM

## 2018-02-18 DIAGNOSIS — Z8759 Personal history of other complications of pregnancy, childbirth and the puerperium: Secondary | ICD-10-CM

## 2018-02-18 DIAGNOSIS — O99210 Obesity complicating pregnancy, unspecified trimester: Secondary | ICD-10-CM | POA: Diagnosis present

## 2018-02-18 DIAGNOSIS — O24425 Gestational diabetes mellitus in childbirth, controlled by oral hypoglycemic drugs: Secondary | ICD-10-CM | POA: Diagnosis present

## 2018-02-18 DIAGNOSIS — R768 Other specified abnormal immunological findings in serum: Secondary | ICD-10-CM

## 2018-02-18 DIAGNOSIS — O10919 Unspecified pre-existing hypertension complicating pregnancy, unspecified trimester: Secondary | ICD-10-CM

## 2018-02-18 DIAGNOSIS — O24419 Gestational diabetes mellitus in pregnancy, unspecified control: Secondary | ICD-10-CM | POA: Diagnosis present

## 2018-02-18 HISTORY — DX: Herpesviral infection, unspecified: B00.9

## 2018-02-18 LAB — COMPREHENSIVE METABOLIC PANEL
ALK PHOS: 95 U/L (ref 38–126)
ALT: 19 U/L (ref 14–54)
AST: 21 U/L (ref 15–41)
Albumin: 2.8 g/dL — ABNORMAL LOW (ref 3.5–5.0)
Anion gap: 7 (ref 5–15)
BUN: 14 mg/dL (ref 6–20)
CALCIUM: 8.6 mg/dL — AB (ref 8.9–10.3)
CHLORIDE: 107 mmol/L (ref 101–111)
CO2: 20 mmol/L — AB (ref 22–32)
CREATININE: 0.6 mg/dL (ref 0.44–1.00)
GFR calc Af Amer: >60 mL/min (ref >60)
Glucose, Bld: 87 mg/dL (ref 65–99)
Potassium: 3.8 mmol/L (ref 3.5–5.1)
Sodium: 134 mmol/L — ABNORMAL LOW (ref 135–145)
Total Bilirubin: 0.7 mg/dL (ref 0.3–1.2)
Total Protein: 6.2 g/dL — ABNORMAL LOW (ref 6.5–8.1)

## 2018-02-18 LAB — CBC
HCT: 35.2 % — ABNORMAL LOW (ref 36.0–46.0)
HEMATOCRIT: 35.5 % — AB (ref 36.0–46.0)
HEMOGLOBIN: 12.3 g/dL (ref 12.0–15.0)
Hemoglobin: 12 g/dL (ref 12.0–15.0)
MCH: 30.4 pg (ref 26.0–34.0)
MCH: 31.3 pg (ref 26.0–34.0)
MCHC: 33.8 g/dL (ref 30.0–36.0)
MCHC: 34.9 g/dL (ref 30.0–36.0)
MCV: 89.6 fL (ref 78.0–100.0)
MCV: 89.9 fL (ref 78.0–100.0)
Platelets: 286 10*3/uL (ref 150–400)
Platelets: 258 10*3/uL (ref 150–400)
RBC: 3.93 MIL/uL (ref 3.87–5.11)
RBC: 3.95 MIL/uL (ref 3.87–5.11)
RDW: 13.1 % (ref 11.5–15.5)
RDW: 13.2 % (ref 11.5–15.5)
WBC: 10.4 10*3/uL (ref 4.0–10.5)
WBC: 13.8 10*3/uL — AB (ref 4.0–10.5)

## 2018-02-18 LAB — TYPE AND SCREEN
ABO/RH(D): A POS
Antibody Screen: NEGATIVE

## 2018-02-18 LAB — RPR: RPR Ser Ql: NONREACTIVE

## 2018-02-18 LAB — GLUCOSE, CAPILLARY
GLUCOSE-CAPILLARY: 92 mg/dL (ref 65–99)
GLUCOSE-CAPILLARY: 97 mg/dL (ref 65–99)
GLUCOSE-CAPILLARY: 94 mg/dL (ref 65–99)

## 2018-02-18 LAB — PROTEIN / CREATININE RATIO, URINE
Creatinine, Urine: 213 mg/dL
Protein Creatinine Ratio: 0.11 mg/mg{Cre} (ref 0.00–0.15)
Total Protein, Urine: 24 mg/dL

## 2018-02-18 LAB — ABO/RH: ABO/RH(D): A POS

## 2018-02-18 MED ORDER — ONDANSETRON HCL 4 MG/2ML IJ SOLN: 4.0000 mg | INTRAMUSCULAR | Status: DC | PRN

## 2018-02-18 MED ORDER — SIMETHICONE 80 MG PO CHEW: 80.0000 mg | CHEWABLE_TABLET | ORAL | Status: DC | PRN

## 2018-02-18 MED ORDER — TETANUS-DIPHTH-ACELL PERTUSSIS 5-2.5-18.5 LF-MCG/0.5 IM SUSP: 0.5000 mL | Freq: Once | INTRAMUSCULAR | Status: DC

## 2018-02-18 MED ORDER — OXYTOCIN 40 UNITS IN LACTATED RINGERS INFUSION - SIMPLE MED
2.5000 [IU]/h | INTRAVENOUS | Status: DC
  Filled 2018-02-18: qty 1000

## 2018-02-18 MED ORDER — PHENYLEPHRINE 40 MCG/ML (10ML) SYRINGE FOR IV PUSH (FOR BLOOD PRESSURE SUPPORT)
80.0000 ug | PREFILLED_SYRINGE | INTRAVENOUS | Status: DC | PRN
  Filled 2018-02-18: qty 5

## 2018-02-18 MED ORDER — SOD CITRATE-CITRIC ACID 500-334 MG/5ML PO SOLN: 30.0000 mL | ORAL | Status: DC | PRN

## 2018-02-18 MED ORDER — DIPHENHYDRAMINE HCL 25 MG PO CAPS: 25.0000 mg | ORAL_CAPSULE | Freq: Four times a day (QID) | ORAL | Status: DC | PRN

## 2018-02-18 MED ORDER — LACTATED RINGERS IV SOLN
500.0000 mL | Freq: Once | INTRAVENOUS | Status: AC
  Administered 2018-02-18: 500 mL via INTRAVENOUS

## 2018-02-18 MED ORDER — OXYTOCIN BOLUS FROM INFUSION
500.0000 mL | Freq: Once | INTRAVENOUS | Status: AC
  Administered 2018-02-18: 500 mL via INTRAVENOUS

## 2018-02-18 MED ORDER — COCONUT OIL OIL: 1.0000 "application " | TOPICAL_OIL | Status: DC | PRN

## 2018-02-18 MED ORDER — WITCH HAZEL-GLYCERIN EX PADS: 1.0000 "application " | MEDICATED_PAD | CUTANEOUS | Status: DC | PRN

## 2018-02-18 MED ORDER — SODIUM CHLORIDE 0.9 % IV SOLN: 250.0000 mL | INTRAVENOUS | Status: DC | PRN

## 2018-02-18 MED ORDER — ONDANSETRON HCL 4 MG/2ML IJ SOLN: 4.0000 mg | Freq: Four times a day (QID) | INTRAMUSCULAR | Status: DC | PRN

## 2018-02-18 MED ORDER — PENICILLIN G POT IN DEXTROSE 60000 UNIT/ML IV SOLN
3.0000 10*6.[IU] | INTRAVENOUS | Status: DC
  Administered 2018-02-18 (×2): 3 10*6.[IU] via INTRAVENOUS
  Filled 2018-02-18 (×6): qty 50

## 2018-02-18 MED ORDER — MEASLES, MUMPS & RUBELLA VAC ~~LOC~~ INJ
0.5000 mL | INJECTION | Freq: Once | SUBCUTANEOUS | Status: DC
  Filled 2018-02-18: qty 0.5

## 2018-02-18 MED ORDER — SODIUM CHLORIDE 0.9 % IV SOLN
5.0000 10*6.[IU] | Freq: Once | INTRAVENOUS | Status: AC
  Administered 2018-02-18: 5 10*6.[IU] via INTRAVENOUS
  Filled 2018-02-18: qty 5

## 2018-02-18 MED ORDER — ONDANSETRON HCL 4 MG PO TABS: 4.0000 mg | ORAL_TABLET | ORAL | Status: DC | PRN

## 2018-02-18 MED ORDER — OXYCODONE-ACETAMINOPHEN 5-325 MG PO TABS: 1.0000 | ORAL_TABLET | ORAL | Status: DC | PRN

## 2018-02-18 MED ORDER — ASPIRIN EC 81 MG PO TBEC
81.0000 mg | DELAYED_RELEASE_TABLET | Freq: Every day | ORAL | Status: DC
  Administered 2018-02-18 – 2018-02-20 (×3): 81 mg via ORAL
  Filled 2018-02-18 (×4): qty 1

## 2018-02-18 MED ORDER — DIBUCAINE 1 % RE OINT: 1.0000 "application " | TOPICAL_OINTMENT | RECTAL | Status: DC | PRN

## 2018-02-18 MED ORDER — SODIUM CHLORIDE 0.9% FLUSH: 3.0000 mL | Freq: Two times a day (BID) | INTRAVENOUS | Status: DC

## 2018-02-18 MED ORDER — EPHEDRINE 5 MG/ML INJ
10.0000 mg | INTRAVENOUS | Status: DC | PRN
  Filled 2018-02-18: qty 2

## 2018-02-18 MED ORDER — BENZOCAINE-MENTHOL 20-0.5 % EX AERO
1.0000 | INHALATION_SPRAY | CUTANEOUS | Status: DC | PRN
Start: 2018-02-18 — End: 2018-02-20

## 2018-02-18 MED ORDER — SENNOSIDES-DOCUSATE SODIUM 8.6-50 MG PO TABS
2.0000 | ORAL_TABLET | ORAL | Status: DC
  Administered 2018-02-19 (×2): 2 via ORAL
  Filled 2018-02-18 (×2): qty 2

## 2018-02-18 MED ORDER — ACETAMINOPHEN 325 MG PO TABS: 650.0000 mg | ORAL_TABLET | ORAL | Status: DC | PRN

## 2018-02-18 MED ORDER — LACTATED RINGERS IV SOLN
500.0000 mL | INTRAVENOUS | Status: DC | PRN
  Administered 2018-02-18: 250 mL via INTRAVENOUS

## 2018-02-18 MED ORDER — SODIUM CHLORIDE 0.9% FLUSH
3.0000 mL | INTRAVENOUS | Status: DC | PRN
Start: 2018-02-18 — End: 2018-02-20
  Administered 2018-02-18: 3 mL via INTRAVENOUS
  Filled 2018-02-18: qty 3

## 2018-02-18 MED ORDER — FLEET ENEMA 7-19 GM/118ML RE ENEM: 1.0000 | ENEMA | RECTAL | Status: DC | PRN

## 2018-02-18 MED ORDER — DIPHENHYDRAMINE HCL 50 MG/ML IJ SOLN: 12.5000 mg | INTRAMUSCULAR | Status: DC | PRN

## 2018-02-18 MED ORDER — TERBUTALINE SULFATE 1 MG/ML IJ SOLN
0.2500 mg | Freq: Once | INTRAMUSCULAR | Status: DC | PRN
  Filled 2018-02-18: qty 1

## 2018-02-18 MED ORDER — OXYTOCIN 40 UNITS IN LACTATED RINGERS INFUSION - SIMPLE MED: 1.0000 m[IU]/min | INTRAVENOUS | Status: DC

## 2018-02-18 MED ORDER — ZOLPIDEM TARTRATE 5 MG PO TABS: 5.0000 mg | ORAL_TABLET | Freq: Every evening | ORAL | Status: DC | PRN

## 2018-02-18 MED ORDER — FENTANYL 2.5 MCG/ML BUPIVACAINE 1/10 % EPIDURAL INFUSION (WH - ANES)
14.0000 mL/h | INTRAMUSCULAR | Status: DC | PRN
  Administered 2018-02-18: 14 mL/h via EPIDURAL
  Filled 2018-02-18: qty 100

## 2018-02-18 MED ORDER — OXYCODONE-ACETAMINOPHEN 5-325 MG PO TABS: 2.0000 | ORAL_TABLET | ORAL | Status: DC | PRN

## 2018-02-18 MED ORDER — IBUPROFEN 600 MG PO TABS
600.0000 mg | ORAL_TABLET | Freq: Four times a day (QID) | ORAL | Status: DC
  Administered 2018-02-18 – 2018-02-20 (×8): 600 mg via ORAL
  Filled 2018-02-18 (×8): qty 1

## 2018-02-18 MED ORDER — FENTANYL CITRATE (PF) 100 MCG/2ML IJ SOLN
100.0000 ug | INTRAMUSCULAR | Status: DC | PRN
  Administered 2018-02-18 (×3): 100 ug via INTRAVENOUS
  Filled 2018-02-18 (×3): qty 2

## 2018-02-18 MED ORDER — LIDOCAINE HCL (PF) 1 % IJ SOLN
30.0000 mL | INTRAMUSCULAR | Status: DC | PRN
  Filled 2018-02-18: qty 30

## 2018-02-18 MED ORDER — LIDOCAINE HCL (PF) 1 % IJ SOLN
INTRAMUSCULAR | Status: DC | PRN
  Administered 2018-02-18: 6 mL via EPIDURAL

## 2018-02-18 MED ORDER — PRENATAL MULTIVITAMIN CH
1.0000 | ORAL_TABLET | Freq: Every day | ORAL | Status: DC
  Administered 2018-02-18 – 2018-02-20 (×3): 1 via ORAL
  Filled 2018-02-18 (×3): qty 1

## 2018-02-18 MED ORDER — LACTATED RINGERS IV SOLN
INTRAVENOUS | Status: DC
  Administered 2018-02-18 (×2): via INTRAVENOUS

## 2018-02-18 MED ORDER — MISOPROSTOL 25 MCG QUARTER TABLET
25.0000 ug | ORAL_TABLET | ORAL | Status: DC | PRN
  Administered 2018-02-18: 25 ug via VAGINAL
  Filled 2018-02-18 (×2): qty 1

## 2018-02-18 MED ORDER — PHENYLEPHRINE 40 MCG/ML (10ML) SYRINGE FOR IV PUSH (FOR BLOOD PRESSURE SUPPORT)
80.0000 ug | PREFILLED_SYRINGE | INTRAVENOUS | Status: DC | PRN
  Filled 2018-02-18: qty 5
  Filled 2018-02-18: qty 10

## 2018-02-18 NOTE — Progress Notes (Signed)
Patient had irregular heart rhythm on auscultation at 0720. EKG ordered and resulted as sinus bradycardia. Upon reassessment at 1045 pulse oximetry reporting heart rate in 30s, Lawson, CNM notified and pt placed on telemetry to assess heart rhythm. Frequent episodes of ventricular bigeminy noted with heart rate fluctuating  49bpm-60bpm. Repeat EKG ordered and Emelda FearFerguson, MD made aware of all above. Repeat EKG resulted as Sinnus rhythm with frequent premature ventricular complexes in a pattern of bigeminy. Emelda FearFerguson, MD to notify Dr. Mina MarbleHoser with Anesthesia.

## 2018-02-18 NOTE — Progress Notes (Signed)
Baby latched at 1445 with assistance from nurse. Did not latch well at 1600. Mother requests pump. At 1630,, mother exits shower and shown how to use pump. At 1730, mother asks for Lactation. Lactation arrives and consults with pt. Pt asks for formula because baby is not satisfied and she only got one drop of colostrum in 15 minutes. Mother states she will resume breastfeeding when her milk comes in. Rush BarerGerber formula given to mom with instructions and assistance.

## 2018-02-18 NOTE — H&P (Addendum)
OBSTETRIC ADMISSION HISTORY AND PHYSICAL  Jamie Taylor is a 32 y.o. female G2P1001 with IUP at [redacted]w[redacted]d by early U/S presenting for IOL for A2GDM, CHTN. She reports +FMs, No LOF, no VB, no blurry vision, headaches or peripheral edema, and RUQ pain.  She plans on breast feeding. She request Nexplanon for birth control. She received her prenatal care at [facility]  Dating: By Early U/S --->  Estimated Date of Delivery: 02/25/18  Sono:   @[redacted]w[redacted]d , CWD, normal anatomy, cephalic presentation, 3205g, 09% EFW, AC 38%. BPP 8/8. AFI wnl. Anterior placenta.  Prenatal History/Complications: Morbid Obesity A2GDM, controlled on Glyburide. A1c 5.5% 07/26/17 CHTN, on ASA (patient states has only had preE with prior pregnancy although with elevated BP at initial prenatal visit that remained persistent). Urine Protein/Cr ratio, CMP 01/30/18 wnl. ?Placental abruption in 2nd trimester, f/u U/S placenta wnl H/o HSV on Valtrex at 34wks GBS+  Past Medical History: Past Medical History:  Diagnosis Date  . Gestational diabetes    glyburide  . HSV (herpes simplex virus) infection   . Morbid obesity with BMI of 50.0-59.9, adult (HCC)   . Pregnancy induced hypertension     Past Surgical History: Past Surgical History:  Procedure Laterality Date  . TONSILLECTOMY      Obstetrical History: OB History    Gravida Para Term Preterm AB Living   2 1 1     1    SAB TAB Ectopic Multiple Live Births           1     Social History: Social History   Socioeconomic History  . Marital status: Single    Spouse name: None  . Number of children: None  . Years of education: None  . Highest education level: None  Social Needs  . Financial resource strain: None  . Food insecurity - worry: None  . Food insecurity - inability: None  . Transportation needs - medical: None  . Transportation needs - non-medical: None  Occupational History  . None  Tobacco Use  . Smoking status: Never Smoker  . Smokeless tobacco: Never  Used  Substance and Sexual Activity  . Alcohol use: No  . Drug use: No  . Sexual activity: Yes    Birth control/protection: None    Comment: Last sex 2 days ago 01/28/2017  Other Topics Concern  . None  Social History Narrative  . None   Family History: Family History  Problem Relation Age of Onset  . Diabetes Mother   . Hypertension Mother   . Hyperlipidemia Father   . Diabetes Father   . Hypertension Father   . Diabetes Paternal Grandmother   . Hyperlipidemia Paternal Grandmother    Allergies: No Known Allergies  Medications Prior to Admission  Medication Sig Dispense Refill Last Dose  . glyBURIDE (DIABETA) 2.5 MG tablet Take 1 tablet (2.5 mg total) by mouth at bedtime. 60 tablet 3 02/17/2018 at Unknown time  . Prenatal Vit-Fe Fumarate-FA (PRENATAL VITAMINS) 28-0.8 MG TABS Take one tablet daily. 30 tablet 9 Past Week at Unknown time  . acetaminophen (TYLENOL) 500 MG tablet Take 500 mg by mouth every 6 (six) hours as needed for mild pain.   More than a month at Unknown time  . aspirin EC 81 MG tablet Take 1 tablet (81 mg total) by mouth daily. 90 tablet 3 More than a month at Unknown time  . triamcinolone ointment (KENALOG) 0.5 % Apply twice a day for a week then once a day for a week. (Patient  not taking: Reported on 02/01/2018) 30 g 0 More than a month at Unknown time  . valACYclovir (VALTREX) 500 MG tablet Take 1 tablet (500 mg total) by mouth 2 (two) times daily. 60 tablet 6 Taking   Review of Systems   All systems reviewed and negative except as stated in HPI  Blood pressure (!) 149/89, pulse (!) 55, temperature 97.6 F (36.4 C), temperature source Oral, resp. rate 20, height 5\' 10"  (1.778 m), weight (!) 420 lb 1.6 oz (190.6 kg). General appearance: alert, cooperative, no distress and morbidly obese Lungs: clear to auscultation bilaterally Heart: regular rate and rhythm Abdomen: soft, non-tender; bowel sounds normal Extremities: Homans sign is negative, no sign of  DVT Presentation: cephalic Fetal monitoringBaseline: 135 bpm, Variability: Good {> 6 bpm), Accelerations: Reactive and Decelerations: Absent Uterine activity - Frequency: irregular and Intensity: mild Dilation: 1.5 Effacement (%): Thick Station: -3 Exam by:: Dr. Nira Retort  External vaginal exam wnl with no lesions noted  Prenatal labs: ABO, Rh: A/Positive/-- (08/01 0850) Antibody: Negative (08/01 0850) Rubella: 2.27 (08/01 0850) RPR: Reactive (12/13 1108)  HBsAg: Negative (08/01 0850)  HIV: Non Reactive (12/13 1108)  GBS: Positive (02/07 1200)  2 hr Glucola abnormal (98, 134, 128) Genetic screening  Normal (AFP) Anatomy US @ [redacted]w[redacted]d wnl although technically difficult due to body habitus  Prenatal Transfer Tool  Maternal Diabetes: Yes:  Diabetes Type:  Insulin/Medication controlled Genetic Screening: Normal Maternal Ultrasounds/Referrals: Normal Fetal Ultrasounds or other Referrals:  None Maternal Substance Abuse:  No Significant Maternal Medications:  Meds include: Other:  Valtrex, Glyburide Significant Maternal Lab Results: Lab values include: Group B Strep positive  Results for orders placed or performed during the hospital encounter of 02/18/18 (from the past 24 hour(s))  CBC   Collection Time: 02/18/18 12:50 AM  Result Value Ref Range   WBC 10.4 4.0 - 10.5 K/uL   RBC 3.93 3.87 - 5.11 MIL/uL   Hemoglobin 12.3 12.0 - 15.0 g/dL   HCT 16.1 (L) 09.6 - 04.5 %   MCV 89.6 78.0 - 100.0 fL   MCH 31.3 26.0 - 34.0 pg   MCHC 34.9 30.0 - 36.0 g/dL   RDW 40.9 81.1 - 91.4 %   Platelets 286 150 - 400 K/uL  Glucose, capillary   Collection Time: 02/18/18  1:25 AM  Result Value Ref Range   Glucose-Capillary 92 65 - 99 mg/dL    Patient Active Problem List   Diagnosis Date Noted  . Indication for care in labor or delivery 02/18/2018  . GBS (group B Streptococcus carrier), +RV culture, currently pregnant 02/05/2018  . Obesity in pregnancy 02/01/2018  . History of herpes genitalis  01/18/2018  . False positive syphilis serology 12/11/2017  . Chronic hypertension during pregnancy, antepartum 10/31/2017  . GDM (gestational diabetes mellitus) 10/05/2017  . History of shingles 10/02/2017  . History of pre-eclampsia 07/26/2017  . Supervision of high risk pregnancy, antepartum 07/26/2017  . BMI 60.0-69.9, adult (HCC) 07/26/2017    Assessment/Plan:  Ireene Ballowe is a 32 y.o. G2P1001 at [redacted]w[redacted]d here for IOL for A2GDM, CHTN with h/o HSV without current outbreak.  #Labor: FB and Cytotec placed. Augment with pitocin once FB out.Marland Kitchen  #Pain: IV Fentanyl, epidural on patient request #FWB: Cat I #ID:  GBS positive - PCN #GDM: Monitor CBGs #MOF: Breast #MOC:Nexplanon #Circ:  N/A  Ellwood Dense, DO  02/18/2018, 1:53 AM  OB FELLOW HISTORY AND PHYSICAL ATTESTATION I have seen and examined this patient; I agree with above documentation in the  resident's note.   IOL for A2GDM. Cytotec and FB for cervical ripening FHT Cat I GBS pos, start PCN Initial CBG 92, monitor q4h; q 1-2 h when in active labor CHTN: not on meds; BPs have been 130s/140s systolic during pregnancy. Initial BP here 149/89. Check CBC, CMP, UPC  Frederik PearJulie P Deandre Stansel, MD OB Fellow 02/18/2018, 2:45 AM

## 2018-02-18 NOTE — Progress Notes (Signed)
Jamie IraniConstance Taylor is a 32 y.o. G2P1001 at 5354w0d by ultrasound admitted for induction of labor due to Gestational diabetes and Hypertension.  Subjective:   Objective: BP (!) 146/72 (BP Location: Left Arm)   Pulse (!) 52   Temp 97.9 F (36.6 C) (Oral)   Resp 19   Ht 5\' 10"  (1.778 m)   Wt (!) 420 lb 1.6 oz (190.6 kg)   SpO2 99%   BMI 60.28 kg/m  No intake/output data recorded. No intake/output data recorded.  FHT:  FHR: 120's bpm, variability: moderate,  accelerations:  Present,  decelerations:  Present variablr UC:   regular, every 4-5 minutes SVE:   Dilation: 5 Effacement (%): Thick Station: -3 Exam by:: Emelda FearFerguson, MD  Labs: Lab Results  Component Value Date   WBC 13.8 (H) 02/18/2018   HGB 12.0 02/18/2018   HCT 35.5 (L) 02/18/2018   MCV 89.9 02/18/2018   PLT 258 02/18/2018    Assessment / Plan: [facility] and gestational diabetes,  progressing well on pitocin  Labor: Progressing normally Preeclampsia:  no signs or symptoms of toxicity Fetal Wellbeing:  Category I Pain Control:  Epidural I/D:  SVE, AROM cl fluid noted. IUPC and FSE placed. Anticipated MOD:  NSVD  Jamie Taylor 02/18/2018, 9:14 AM

## 2018-02-18 NOTE — Progress Notes (Signed)
Labor Progress Note Jamie IraniConstance Speak is a 32 y.o. G2P1001 at 10750w0d presented for IOL for A2GDM, CHTN.  S: Patient uncomfortable with contractions. Patient reports foley leaking when she went to the bathroom. FB subsequently fell out with balloon deflated.  O:  BP (!) 149/74   Pulse (!) 53   Temp 97.6 F (36.4 C) (Oral)   Resp 20   Ht 5\' 10"  (1.778 m)   Wt (!) 420 lb 1.6 oz (190.6 kg)   BMI 60.28 kg/m   ZOX:[id number]:baseline rate 120, moderate variability, + acels, no decels Toco: ctx minimal  CVE: Dilation: 3 Effacement (%): Thick Cervical Position: Posterior Station: -3 Presentation: Vertex Exam by:: Dr. Nira Retortegele  A&P: 32 y.o. G2P1001 3250w0d here for IOL for A2GDM, CHTN. #Labor: Progressing well. S/p Cytotec x1. FB out but balloon deflated. On repeat exam, cervix responding appropriately, FB not replaced, patient in agreement. Will augment with Pitocin once cervix favorable. #Pain: IV Fentanyl, epidural on patient request #FWB: Cat I #GBS positive - PCN #GDM:  CBG well controlled, continue to monitor q4h #CHTN: admission labs wnl. Bps 140s, asymptomatic. Continue to monitor.  Ellwood DenseAlison Rumball, DO 3:47 AM

## 2018-02-18 NOTE — Anesthesia Procedure Notes (Signed)
Epidural Patient location during procedure: OB Start time: 02/18/2018 7:34 AM End time: 02/18/2018 7:58 AM  Staffing Anesthesiologist: Trevor IhaHouser, Stephen A, MD Performed: anesthesiologist   Preanesthetic Checklist Completed: patient identified, site marked, surgical consent, pre-op evaluation, timeout performed, IV checked, risks and benefits discussed and monitors and equipment checked  Epidural Patient position: sitting Prep: site prepped and draped and DuraPrep Patient monitoring: continuous pulse ox and blood pressure Approach: midline Location: L3-L4 Injection technique: LOR air  Needle:  Needle type: Tuohy  Needle gauge: 17 G Needle length: 9 cm and 9 Needle insertion depth: 5 cm cm Catheter type: closed end flexible Catheter size: 19 Gauge Catheter at skin depth: 10 cm Test dose: negative  Assessment Events: blood not aspirated, injection not painful, no injection resistance, negative IV test and no paresthesia

## 2018-02-18 NOTE — Anesthesia Postprocedure Evaluation (Signed)
Anesthesia Post Note  Patient: Jamie Taylor  Procedure(s) Performed: AN AD HOC LABOR EPIDURAL     Patient location during evaluation: Mother Baby Anesthesia Type: Epidural Level of consciousness: awake Pain management: pain level controlled Vital Signs Assessment: post-procedure vital signs reviewed and stable Respiratory status: spontaneous breathing Cardiovascular status: stable Postop Assessment: no headache, no backache, epidural receding, patient able to bend at knees, no apparent nausea or vomiting and adequate PO intake Anesthetic complications: no    Last Vitals:  Vitals:   02/18/18 1430 02/18/18 1530  BP: (!) 136/50 140/65  Pulse: 63 76  Resp: 20 18  Temp: 37.6 C 37.3 C  SpO2:      Last Pain:  Vitals:   02/18/18 1530  TempSrc: Oral  PainSc:    Pain Goal: Patients Stated Pain Goal: 0 (02/18/18 0910)               Fanny DanceMULLINS,Ravin Denardo

## 2018-02-18 NOTE — Anesthesia Pain Management Evaluation Note (Signed)
  [facility] Pain Management Visit Note  Patient: Jamie Taylor, 32 y.o., female  "[facility] We have an anesthesia team available at all times to provide care throughout the hospital, including epidural management and anesthesia for C-section. I don't know your plan for the delivery whether it a natural birth, water birth, IV sedation, nitrous supplementation, doula or epidural, but we want to meet your pain goals."   1.Was your pain managed to your expectations on prior hospitalizations?   Yes   2.What is your expectation for pain management during this hospitalization?     Epidural  3.How can we help you reach that goal?   Record the patient's initial score and the patient's pain goal.   Pain: 8  Pain Goal: 2 [facility] wants you to be able to say your pain was always managed very well.  Laban EmperorMalinova,Jamie Taylor 02/18/2018

## 2018-02-18 NOTE — Anesthesia Preprocedure Evaluation (Addendum)
Anesthesia Evaluation  Patient identified by MRN, date of birth, ID band Patient awake    Reviewed: Allergy & Precautions, Patient's Chart, lab work & pertinent test results  Airway Mallampati: III   Neck ROM: Full    Dental no notable dental hx.    Pulmonary neg pulmonary ROS,    Pulmonary exam normal        Cardiovascular hypertension, Normal cardiovascular exam     Neuro/Psych negative neurological ROS     GI/Hepatic   Endo/Other  diabetes, Gestational  Renal/GU      Musculoskeletal   Abdominal   Peds  Hematology   Anesthesia Other Findings   Reproductive/Obstetrics                             Lab Results  Component Value Date   WBC 13.8 (H) 02/18/2018   HGB 12.0 02/18/2018   HCT 35.5 (L) 02/18/2018   MCV 89.9 02/18/2018   PLT 258 02/18/2018    Anesthesia Physical Anesthesia Plan  ASA: IV  Anesthesia Plan: Epidural   Post-op Pain Management:    Induction:   PONV Risk Score and Plan:   Airway Management Planned:   Additional Equipment:   Intra-op Plan:   Post-operative Plan:   Informed Consent: I have reviewed the patients History and Physical, chart, labs and discussed the procedure including the risks, benefits and alternatives for the proposed anesthesia with the patient or authorized representative who has indicated his/her understanding and acceptance.     Plan Discussed with:   Anesthesia Plan Comments:         Anesthesia Quick Evaluation

## 2018-02-18 NOTE — Progress Notes (Signed)
Labor Progress Note Teena IraniConstance Fetting is a 32 y.o. G2P1001 at 3047w0d presented for IOL for A2GDM and CHTN.  S: Patient uncomfortable with contractions.  O:  BP (!) 147/65   Pulse (!) 52   Temp 97.8 F (36.6 C) (Oral)   Resp 20   Ht 5\' 10"  (1.778 m)   Wt (!) 420 lb 1.6 oz (190.6 kg)   BMI 60.28 kg/m   XBJ:YNWGNFAOFHT:baseline rate 125, moderate variability, no acels, no decels Toco: ctx minimal, however likely difficult to measure due to body habitus  CVE: Dilation: 3.5 Effacement (%): 50 Cervical Position: Posterior Station: -3 Presentation: Vertex Exam by:: Laural RoesB. Parks, RN  A&P: 32 y.o. G2P1001 6447w0d here for IOL for A2GDM and CHTN. #Labor: Progressing slowly. S/p Cytotec x1 with some cervical change. Will start Pitocin for augmentation. #Pain: epidural on patient request #FWB: Cat I #GBS positive, on PCN #GDM: CBGs well controlled, continue to monitor #CHTN: 140s SBP, asymptomatic. Continue to monitor  Ellwood DenseAlison Jeliyah Middlebrooks, DO 6:12 AM

## 2018-02-19 LAB — BIRTH TISSUE RECOVERY COLLECTION (PLACENTA DONATION)

## 2018-02-19 MED ORDER — AMLODIPINE BESYLATE 5 MG PO TABS
5.0000 mg | ORAL_TABLET | Freq: Every day | ORAL | Status: DC
  Administered 2018-02-19 – 2018-02-20 (×2): 5 mg via ORAL
  Filled 2018-02-19 (×2): qty 1

## 2018-02-19 MED ORDER — AMLODIPINE BESYLATE 5 MG PO TABS: 5.0000 mg | ORAL_TABLET | Freq: Every day | ORAL | 1 refills | Status: DC

## 2018-02-19 NOTE — Lactation Note (Signed)
This note was copied from a baby's chart. Lactation Consultation Note  Patient Name: Girl Teena IraniConstance Constancio WUJWJ'XToday's Date: 02/19/2018 Reason for consult: Initial assessment;Term;Maternal endocrine disorder;Difficult latch Type of Endocrine Disorder?: Diabetes  P2 Mom of 39 wk baby at 432 hrs old.  Risk factors for breastfeeding include CHTN, GDM (glyburide), obesity  Mom states with her first baby, she formula fed in hospital because she "had nothing", and her milk came in when she got home.  But she decided to continue with the formula, because baby was used to it.  Mom states she wants to breastfeed this baby.  Set up with DEBP about 8 hrs post delivery due to difficulty latching.  Mom has tried to pump a couple times.  Reviewed cleaning and milk storage.   Baby has latched to breast this evening.    Encouraged Mom to keep baby STS as much as possible, (baby dressed and swaddled sleeping in crib), and watch for feeding cues.  Encouraged Mom to call for help with latch prn.  Mom states that baby latches, but she still choosing to give formula.   Encouraged Mom to double pump on initiation setting each time baby is supplemented.    Lactation brochure given.  Mom aware of IP and OP lactation support available.  Suggested to Mom that she make an OP appointment once her milk volume comes in, and get some help with positioning and latching.    Interventions Interventions: Breast feeding basics reviewed;Skin to skin;DEBP  Lactation Tools Discussed/Used WIC Program: No Pump Review: Setup, frequency, and cleaning;Milk Storage Initiated by:: RN Date initiated:: 02/18/18   Consult Status Consult Status: Follow-up Date: 02/20/18 Follow-up type: In-patient    Judee ClaraSmith, Cherrie Franca E 02/19/2018, 8:29 PM

## 2018-02-19 NOTE — Progress Notes (Signed)
Post Partum Day 1 Subjective: no complaints, up ad lib and tolerating PO  Objective: Blood pressure (!) 147/61, pulse 62, temperature 98.9 F (37.2 C), temperature source Oral, resp. rate 18, height 5\' 10"  (1.778 m), weight (!) 190.6 kg (420 lb 1.6 oz), SpO2 99 %, unknown if currently breastfeeding.  Physical Exam:  General: alert, cooperative, appears stated age and no distress Lochia: appropriate Uterine Fundus: firm Incision: n/a DVT Evaluation: No evidence of DVT seen on physical exam. No cords or calf tenderness.  Recent Labs    02/18/18 0050 02/18/18 0706  HGB 12.3 12.0  HCT 35.2* 35.5*    Assessment/Plan: Plan for discharge tomorrow, Breastfeeding and Lactation consult  Desires nexplanon for BC.    LOS: 1 day   Loni MuseKate Timberlake 02/19/2018, 6:55 AM

## 2018-02-20 MED ORDER — FUROSEMIDE 20 MG PO TABS
20.0000 mg | ORAL_TABLET | Freq: Once | ORAL | Status: AC
  Administered 2018-02-20: 20 mg via ORAL
  Filled 2018-02-20: qty 1

## 2018-02-20 MED ORDER — AMLODIPINE BESYLATE 10 MG PO TABS: 10.0000 mg | ORAL_TABLET | Freq: Every day | ORAL | Status: DC

## 2018-02-20 MED ORDER — AMLODIPINE BESYLATE 10 MG PO TABS: 10.0000 mg | ORAL_TABLET | Freq: Every day | ORAL | 0 refills | Status: AC

## 2018-02-20 NOTE — Discharge Instructions (Signed)
Vaginal Delivery, Care After °Refer to this sheet in the next few weeks. These instructions provide you with information about caring for yourself after vaginal delivery. Your health care provider may also give you more specific instructions. Your treatment has been planned according to current medical practices, but problems sometimes occur. Call your health care provider if you have any problems or questions. °What can I expect after the procedure? °After vaginal delivery, it is common to have: °· Some bleeding from your vagina. °· Soreness in your abdomen, your vagina, and the area of skin between your vaginal opening and your anus (perineum). °· Pelvic cramps. °· Fatigue. ° °Follow these instructions at home: °Medicines °· Take over-the-counter and prescription medicines only as told by your health care provider. °· If you were prescribed an antibiotic medicine, take it as told by your health care provider. Do not stop taking the antibiotic until it is finished. °Driving ° °· Do not drive or operate heavy machinery while taking prescription pain medicine. °· Do not drive for 24 hours if you received a sedative. °Lifestyle °· Do not drink alcohol. This is especially important if you are breastfeeding or taking medicine to relieve pain. °· Do not use tobacco products, including cigarettes, chewing tobacco, or e-cigarettes. If you need help quitting, ask your health care provider. °Eating and drinking °· Drink at least 8 eight-ounce glasses of water every day unless you are told not to by your health care provider. If you choose to breastfeed your baby, you may need to drink more water than this. °· Eat high-fiber foods every day. These foods may help prevent or relieve constipation. High-fiber foods include: °? Whole grain cereals and breads. °? Brown rice. °? Beans. °? Fresh fruits and vegetables. °Activity °· Return to your normal activities as told by your health care provider. Ask your health care provider  what activities are safe for you. °· Rest as much as possible. Try to rest or take a nap when your baby is sleeping. °· Do not lift anything that is heavier than your baby or 10 lb (4.5 kg) until your health care provider says that it is safe. °· Talk with your health care provider about when you can engage in sexual activity. This may depend on your: °? Risk of infection. °? Rate of healing. °? Comfort and desire to engage in sexual activity. °Vaginal Care °· If you have an episiotomy or a vaginal tear, check the area every day for signs of infection. Check for: °? More redness, swelling, or pain. °? More fluid or blood. °? Warmth. °? Pus or a bad smell. °· Do not use tampons or douches until your health care provider says this is safe. °· Watch for any blood clots that may pass from your vagina. These may look like clumps of dark red, brown, or black discharge. °General instructions °· Keep your perineum clean and dry as told by your health care provider. °· Wear loose, comfortable clothing. °· Wipe from front to back when you use the toilet. °· Ask your health care provider if you can shower or take a bath. If you had an episiotomy or a perineal tear during labor and delivery, your health care provider may tell you not to take baths for a certain length of time. °· Wear a bra that supports your breasts and fits you well. °· If possible, have someone help you with household activities and help care for your baby for at least a few days after   you leave the hospital. °· Keep all follow-up visits for you and your baby as told by your health care provider. This is important. °Contact a health care provider if: °· You have: °? Vaginal discharge that has a bad smell. °? Difficulty urinating. °? Pain when urinating. °? A sudden increase or decrease in the frequency of your bowel movements. °? More redness, swelling, or pain around your episiotomy or vaginal tear. °? More fluid or blood coming from your episiotomy or  vaginal tear. °? Pus or a bad smell coming from your episiotomy or vaginal tear. °? A fever. °? A rash. °? Little or no interest in activities you used to enjoy. °? Questions about caring for yourself or your baby. °· Your episiotomy or vaginal tear feels warm to the touch. °· Your episiotomy or vaginal tear is separating or does not appear to be healing. °· Your breasts are painful, hard, or turn red. °· You feel unusually sad or worried. °· You feel nauseous or you vomit. °· You pass large blood clots from your vagina. If you pass a blood clot from your vagina, save it to show to your health care provider. Do not flush blood clots down the toilet without having your health care provider look at them. °· You urinate more than usual. °· You are dizzy or light-headed. °· You have not breastfed at all and you have not had a menstrual period for 12 weeks after delivery. °· You have stopped breastfeeding and you have not had a menstrual period for 12 weeks after you stopped breastfeeding. °Get help right away if: °· You have: °? Pain that does not go away or does not get better with medicine. °? Chest pain. °? Difficulty breathing. °? Blurred vision or spots in your vision. °? Thoughts about hurting yourself or your baby. °· You develop pain in your abdomen or in one of your legs. °· You develop a severe headache. °· You faint. °· You bleed from your vagina so much that you fill two sanitary pads in one hour. °This information is not intended to replace advice given to you by your health care provider. Make sure you discuss any questions you have with your health care provider. °Document Released: 12/09/2000 Document Revised: 05/25/2016 Document Reviewed: 12/27/2015 °[employer] Interactive Patient Education © 2018 [employer] ° °

## 2018-02-20 NOTE — Lactation Note (Signed)
This note was copied from a baby's chart. Lactation Consultation Note  Patient Name: Jamie Taylor Date: 02/20/2018 Reason for consult: Follow-up assessment;Infant weight loss(2% weight loss , Bili check 8.7 )  Baby is 25 hours old  LC reviewed and updated the doc flow sheets  As LC entered the room , mom holding baby and baby sound asleep.  Per mom baby just finished breast feeding for 10 mins.  Sore nipple and engorgement prevention and tx reviewed. LC encouraged STS feedings until the baby can stay awake for feeding and gaining well.  Offer both breast prior to supplementing due to weight loss being only at 2 %.  Per mom will have a DEBP - insurance at home. Also LC showed mom how to use hand pump from DEBP kit.  Mother informed of post-discharge support and given phone number to the lactation department, including services for phone call assistance; out-patient appointments; and breastfeeding support group. List of other breastfeeding resources in the community given in the handout. Encouraged mother to call for problems or concerns related to breastfeeding.   Maternal Data    Feeding Feeding Type: Breast Fed Nipple Type: Slow - flow Length of feed: 10 min  LATCH Score ( Latch score by the RN )  Latch: Repeated attempts needed to sustain latch, nipple held in mouth throughout feeding, stimulation needed to elicit sucking reflex.  Audible Swallowing: A few with stimulation  Type of Nipple: Everted at rest and after stimulation  Comfort (Breast/Nipple): Soft / non-tender  Hold (Positioning): Assistance needed to correctly position infant at breast and maintain latch.  LATCH Score: 7  Interventions Interventions: Breast feeding basics reviewed  Lactation Tools Discussed/Used Pump Review: Milk Storage   Consult Status Consult Status: Complete Date: 02/20/18    Myer Haff 02/20/2018, 8:56 AM

## 2018-02-20 NOTE — Discharge Summary (Addendum)
OB Discharge Summary    Patient Name: Jamie Taylor DOB: Aug 14, 1986 MRN: [id number] Date of admission: 02/18/2018  Delivering MD: Wyvonnia Dusky D )  Date of discharge: 02/20/2018  Admitting diagnosis: IOL for A2GDM, CHTN Intrauterine pregnancy: [redacted]w[redacted]d    Secondary diagnosis:  Active Problems:   Patient Active Problem List   Diagnosis Date Noted  . Indication for care in labor or delivery 02/18/2018  . GBS (group B Streptococcus carrier), +RV culture, currently pregnant 02/05/2018  . Obesity in pregnancy 02/01/2018  . History of herpes genitalis 01/18/2018  . False positive syphilis serology 12/11/2017  . Chronic hypertension during pregnancy, antepartum 10/31/2017  . GDM (gestational diabetes mellitus) 10/05/2017  . History of shingles 10/02/2017  . History of pre-eclampsia 07/26/2017  . Supervision of high risk pregnancy, antepartum 07/26/2017  . BMI 60.0-69.9, adult (HCC) 07/26/2017   Additional problems:      Discharge diagnosis: Term Pregnancy Delivered, CHTN and GDM A2                                                                                               Post partum procedures:None  Complications: None  Hospital course:  Induction of Labor With Vaginal Delivery   32 y.o. yo G2P2002 at [redacted]w[redacted]d was admitted to the hospital 02/18/2018 for induction of labor.  Indication for induction: A2 DM and CHTN.  Patient had an uncomplicated labor course as follows: Membrane Rupture Time/Date: 8:47 AM ,02/18/2018   Intrapartum Procedures: Episiotomy: None [1]                                         Lacerations:  None [1]  Patient had delivery of a Viable infant.  Information for the patient's newborn:  Lavonne, Kinderman [[id number]]  Delivery Method: Vag-Spont     02/18/2018  Details of delivery can be found in separate delivery note.  Patient had a routine postpartum course however did have persistent elevated BP and was started on Norvasc 5mg  and Lasix x1 with some decrease  in SBP prior to discharge however persistently elevated. Norvasc was increased to 10mg  prior to discharge. Patient is discharged home 02/20/18 with 1 week follow up for BP check.  Physical exam  Vitals:   02/20/18 0721 02/20/18 0917  BP: (!) 146/70 (!) 154/65  Pulse: (!) 59   Resp:    Temp:    SpO2:      General: alert, cooperative and no distress CV: regular rate and rhythm Lungs: CTAB Lochia: appropriate Uterine Fundus: firm Incision: N/A DVT Evaluation: No evidence of DVT seen on physical exam.  Labs: No results found for this or any previous visit (from the past 24 hour(s)). Discharge instruction: per After Visit Summary and "Baby and Me Booklet".  After visit meds:  No Known Allergies  Allergies as of 02/20/2018   No Known Allergies     Medication List    STOP taking these medications   aspirin EC 81 MG tablet   glyBURIDE 2.5 MG tablet Commonly known as:  DIABETA   triamcinolone ointment 0.5 % Commonly known as:  KENALOG   valACYclovir 500 MG tablet Commonly known as:  VALTREX     TAKE these medications   amLODipine 10 MG tablet Commonly known as:  NORVASC Take 1 tablet (10 mg total) by mouth daily. Start taking on:  02/21/2018   Prenatal Vitamins 28-0.8 MG Tabs Take one tablet daily. What changed:    how much to take  how to take this  when to take this  additional instructions      Diet: routine diet  Activity: Advance as tolerated. Pelvic rest for 6 weeks.   Outpatient follow up: 4 weeks, BP check in 1 week. Future Appointments: No future appointments.  Follow up Appt: No Follow-up on file.  Postpartum contraception: Nexplanon  Newborn Data: APGAR (1 MIN): 9   APGAR (5 MINS): 9    Baby Feeding: Breast Disposition:home with mother  Ellwood Denselison Rumball, DO  02/20/2018  OB FELLOW DISCHARGE ATTESTATION  I have seen and examined this patient and agree with above documentation in the resident's note.   Frederik PearJulie P Degele, MD OB  Fellow

## 2018-02-22 ENCOUNTER — Encounter: Payer: BLUE CROSS/BLUE SHIELD | Admitting: Obstetrics and Gynecology

## 2018-02-22 ENCOUNTER — Other Ambulatory Visit: Payer: BLUE CROSS/BLUE SHIELD

## 2018-03-13 ENCOUNTER — Telehealth: Payer: Self-pay | Admitting: *Deleted

## 2018-03-13 NOTE — Telephone Encounter (Signed)
Jamie RayaConstance called 03/08/18 12:34 and left a message she delivered 02/18/18 and still having issues with swelling feet/legs/ankles.States she is taking the bp medicine ; also wants to  Know if she should make an appointment - has a knot in her stomach

## 2018-03-15 ENCOUNTER — Ambulatory Visit: Payer: BLUE CROSS/BLUE SHIELD | Admitting: Student

## 2018-03-16 ENCOUNTER — Telehealth: Payer: Self-pay

## 2018-03-16 NOTE — Telephone Encounter (Signed)
Spoke with patient in regards to her swelling in feet. Advised patient that she will need to be seen for the swelling. She is currently still taking blood pressure medications at this time. Advised her to try and purchase a blood pressure machine to monitor her pressure since she has some swelling, elevate her feet as much as possible. I will send a message to office to reschedule her appointment.

## 2018-03-16 NOTE — Telephone Encounter (Signed)
Pt called the office stating that she is still having headaches and swelling and sharp pain in her rt foot. Pt states that she also has a knot in her belly. Pt states that she can't come in because she doesn't have a Arts administratorbaby sitter. Advised pt to come in to [facility] to be examined this evening if she finds a Comptrollersitter. Pt verbalized understanding.

## 2018-03-16 NOTE — Telephone Encounter (Signed)
Opened in error

## 2018-03-22 IMAGING — US US FETAL BPP W/ NON-STRESS
1 series · 13 of 14 positions shown · non-contrast
Comparison: none

[Series 1: us fetal bpp w/nonstress · 14 acquisitions, 13 frames shown]
[im 1/14]
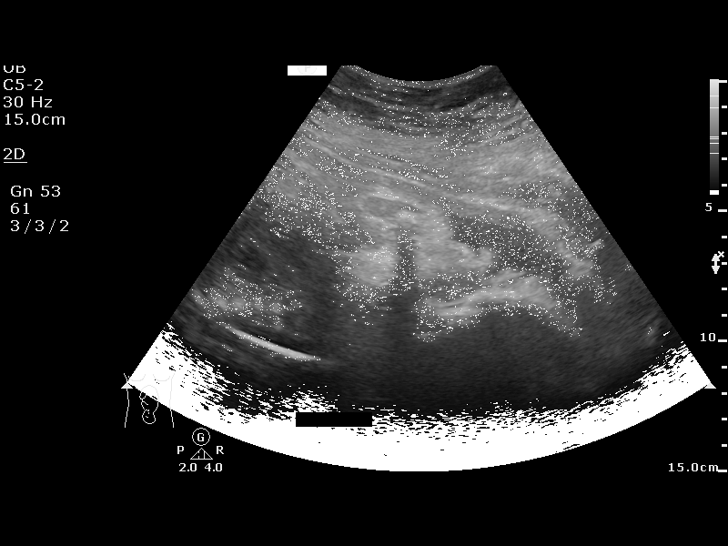
[im 2/14]
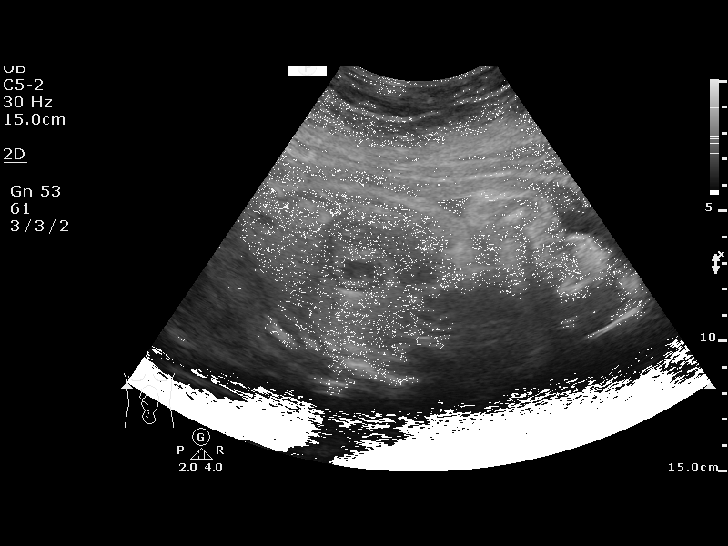
[im 3/14]
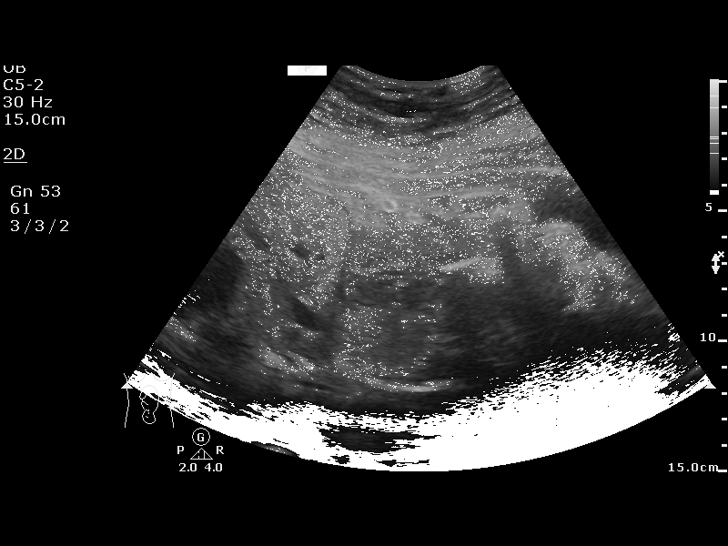
[im 4/14]
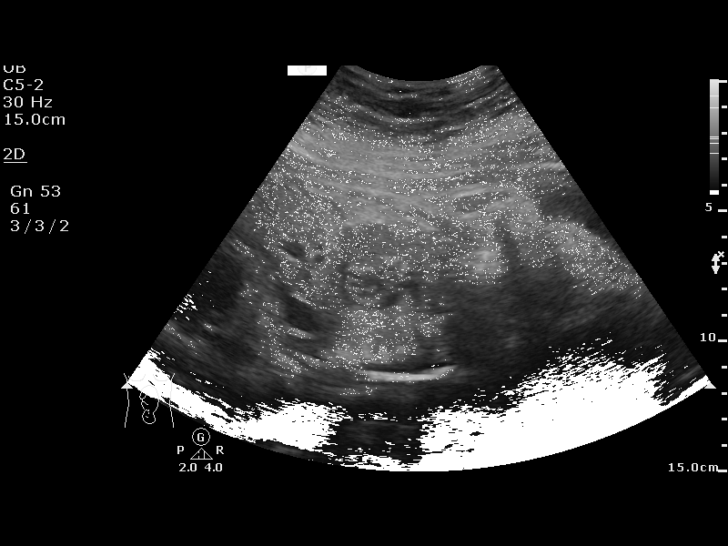
[im 5/14]
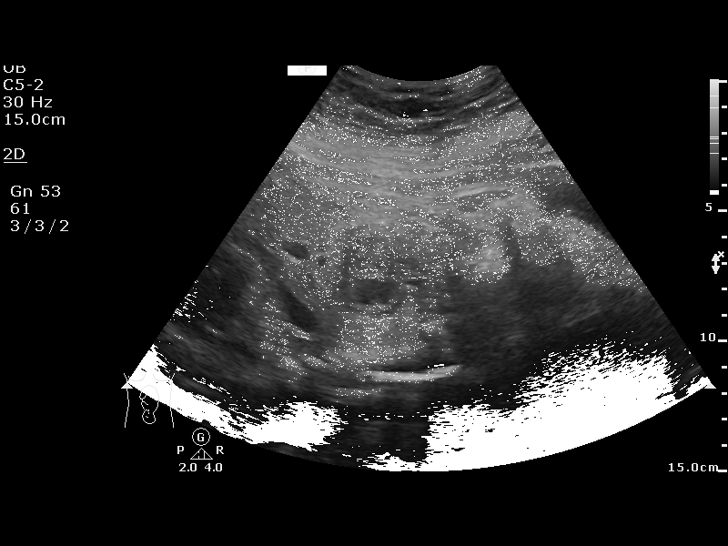
[im 6/14]
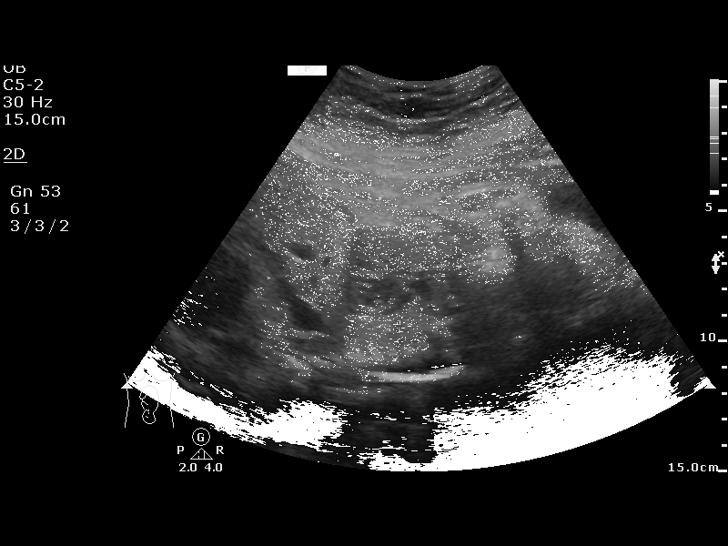
[im 8/14]
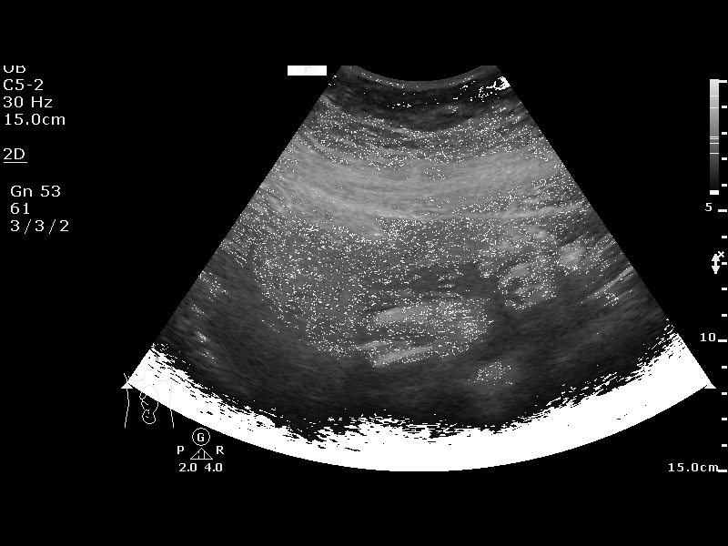
[im 9/14]
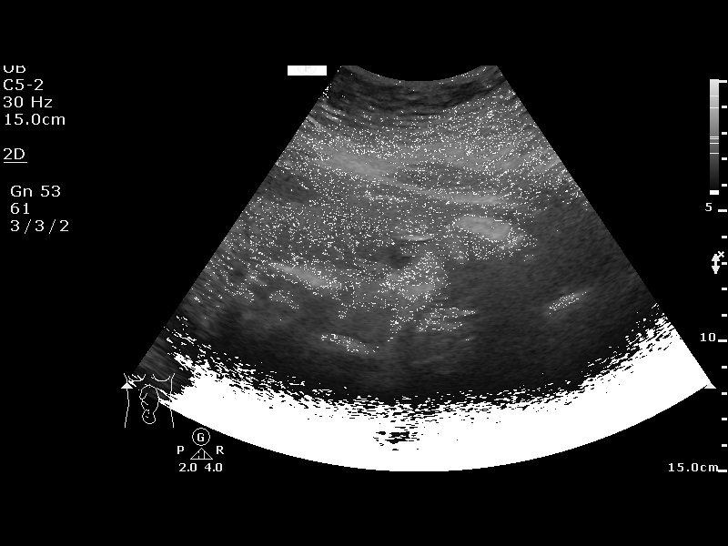
[im 10/14]
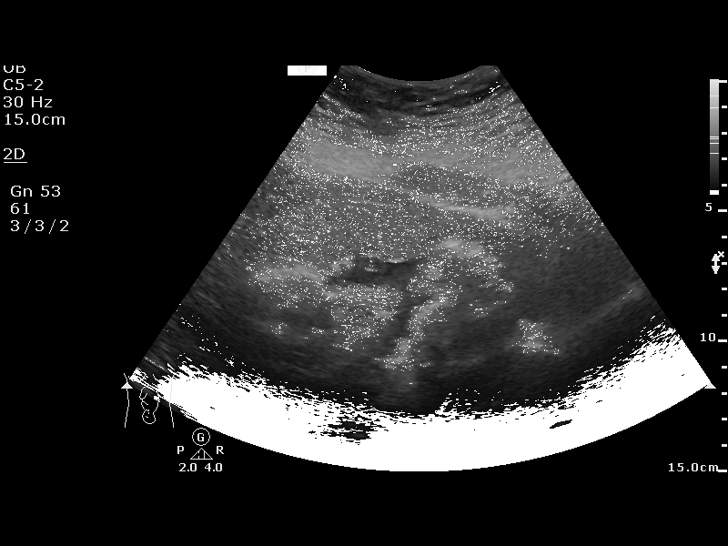
[im 11/14]
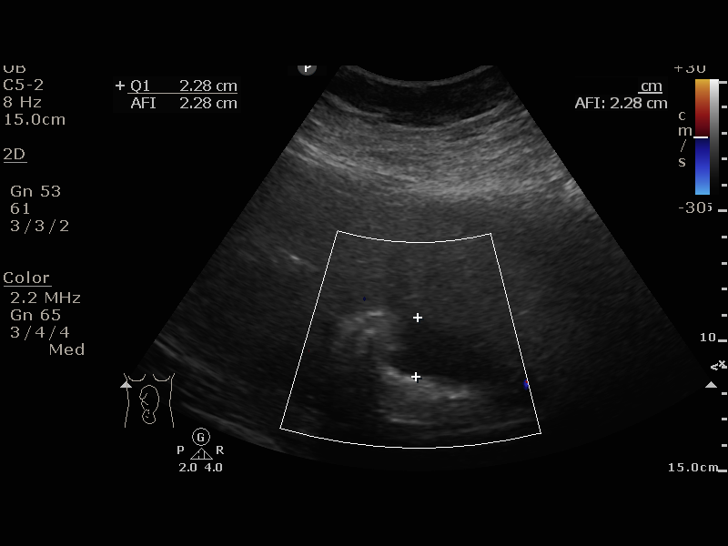
[im 12/14]
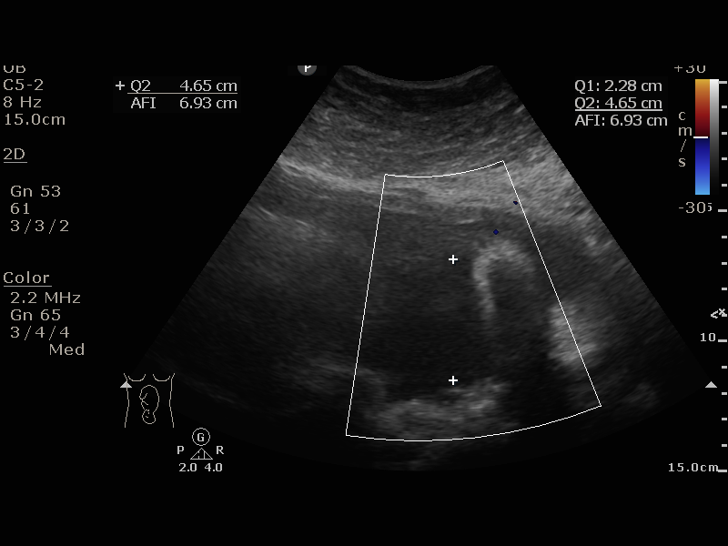
[im 13/14]
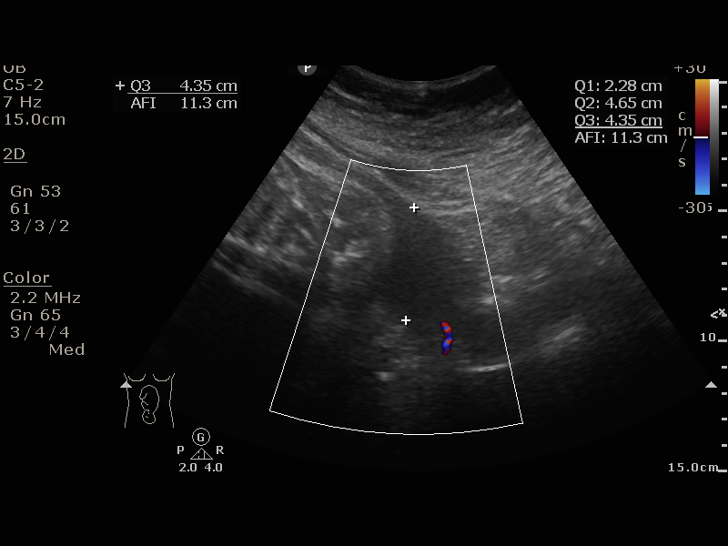
[im 14/14]
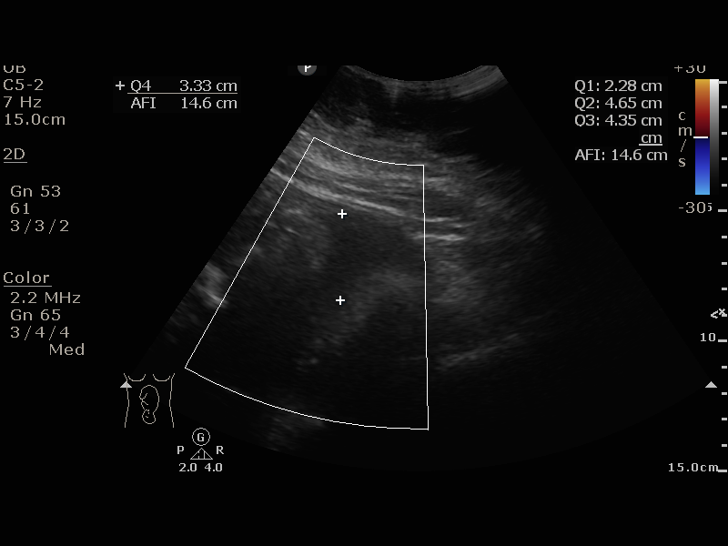

[13 of 14 positions shown; findings below may reference images not displayed]

Women[facility]
[REDACTED]

1  US FETAL BPP W/NONSTRESS                    76818.4

Service(s) Provided

Indications

33 weeks gestation of pregnancy
Unspecified pre-existing hypertension
complicating pregnancy, third trimester
Gestational diabetes in pregnancy,
controlled by oral hypoglycemic drugs
OB History

Blood Type:            Height:  5'10"  Weight (lb):  399       BMI:
Gravidity:    2         Term:   1        Prem:   0        SAB:   0
TOP:          0       Ectopic:  0        Living: 1
Fetal Evaluation

Num Of Fetuses:     1
Preg. Location:     Intrauterine
Cardiac Activity:   Observed
Presentation:       Cephalic

Amniotic Fluid
AFI FV:      Subjectively within normal limits

AFI Sum(cm)     %Tile       Largest Pocket(cm)
14.61           52

RUQ(cm)       RLQ(cm)       LUQ(cm)        LLQ(cm)
2.28
Biophysical Evaluation

Amniotic F.V:   Pocket => 2 cm two         F. Tone:        Observed
planes
F. Movement:    Observed                   N.S.T:          Reactive
F. Breathing:   Observed                   Score:          [DATE]
Gestational Age

Best:          33w 4d     Det. By:  Early Ultrasound         EDD:   02/25/18
(08/21/17)
Comments

Technically limited exam due to maternal body habitus.
Impression

AFI normal
Recommendations

Continue antenatal testing
Bagyt Dusenova, DO

## 2018-04-02 ENCOUNTER — Encounter: Payer: Self-pay | Admitting: Advanced Practice Midwife

## 2018-04-02 ENCOUNTER — Ambulatory Visit (INDEPENDENT_AMBULATORY_CARE_PROVIDER_SITE_OTHER): Payer: BLUE CROSS/BLUE SHIELD | Admitting: Advanced Practice Midwife

## 2018-04-02 ENCOUNTER — Encounter: Payer: Self-pay | Admitting: *Deleted

## 2018-04-02 ENCOUNTER — Ambulatory Visit: Payer: BLUE CROSS/BLUE SHIELD | Admitting: Advanced Practice Midwife

## 2018-04-02 DIAGNOSIS — Z3046 Encounter for surveillance of implantable subdermal contraceptive: Secondary | ICD-10-CM

## 2018-04-02 DIAGNOSIS — Z1389 Encounter for screening for other disorder: Secondary | ICD-10-CM

## 2018-04-02 DIAGNOSIS — Z30017 Encounter for initial prescription of implantable subdermal contraceptive: Secondary | ICD-10-CM

## 2018-04-02 DIAGNOSIS — O24415 Gestational diabetes mellitus in pregnancy, controlled by oral hypoglycemic drugs: Secondary | ICD-10-CM

## 2018-04-02 LAB — POCT URINALYSIS DIP (DEVICE)
BILIRUBIN URINE: NEGATIVE
GLUCOSE, UA: NEGATIVE mg/dL
Hgb urine dipstick: NEGATIVE
Ketones, ur: NEGATIVE mg/dL
LEUKOCYTES UA: NEGATIVE
NITRITE: NEGATIVE
Protein, ur: NEGATIVE mg/dL
Specific Gravity, Urine: 1.02 (ref 1.005–1.030)
Urobilinogen, UA: 0.2 mg/dL (ref 0.0–1.0)
pH: 7 (ref 5.0–8.0)

## 2018-04-02 LAB — POCT PREGNANCY, URINE: Preg Test, Ur: NEGATIVE

## 2018-04-02 MED ORDER — FUROSEMIDE 20 MG PO TABS: 20.0000 mg | ORAL_TABLET | Freq: Every day | ORAL | 0 refills | Status: AC

## 2018-04-02 MED ORDER — ETONOGESTREL 68 MG ~~LOC~~ IMPL
68.0000 mg | DRUG_IMPLANT | Freq: Once | SUBCUTANEOUS | Status: AC
Start: 2018-04-02 — End: 2018-04-02
  Administered 2018-04-02: 68 mg via SUBCUTANEOUS

## 2018-04-02 NOTE — Progress Notes (Signed)
Subjective:     Jamie Taylor is a 32 y.o. female who presents for a postpartum visit. She is 6 weeks postpartum following a spontaneous vaginal delivery. I have fully reviewed the prenatal and intrapartum course. The delivery was at 39 gestational weeks. Outcome: spontaneous vaginal delivery. Anesthesia: epidural. Postpartum course has been remarkable for fluid retention of lower extremitis. Baby's course has been unremarkable. Baby is feeding by bottle - Similac Sensitive RS. Bleeding no bleeding. Bowel function is normal. Bladder function is normal. Patient is not sexually active. Contraception method is none. Postpartum depression screening: negative.  Patient is interested in Nexplanon today.   She has continued to take the Norvasc 10mg  q day. She only has a couple of pills left. She is still having a lot of edema in her feet and legs. She denies any chest pain, shortness of breath or difficulty breathing. She does still have some pain in her both her feet. The right a little more than the left.   The following portions of the patient's history were reviewed and updated as appropriate: allergies, current medications, past family history, past medical history, past social history, past surgical history and problem list.  Review of Systems Pertinent items are noted in HPI.   Objective:    BP 130/78 (BP Location: Right Arm, Patient Position: Sitting, Cuff Size: Large)   Pulse (!) 58   Wt (!) 403 lb (182.8 kg)   Breastfeeding? No   BMI 57.82 kg/m   General:  alert, cooperative and appears stated age   Breasts:  negative  Lungs: clear to auscultation bilaterally  Heart:  regular rate and rhythm, S1, S2 normal, no murmur, click, rub or gallop  Abdomen: soft, non-tender; bowel sounds normal; no masses,  no organomegaly   Vulva:  not evaluated  Vagina: not evaluated  Cervix:  not examined   Corpus: not examined  Adnexa:  not evaluated  Rectal Exam: Not performed.         GYNECOLOGY  OFFICE PROCEDURE NOTE  Jamie Taylor is a 31 y.o. G2P2002 here for  Nexplanon insertion.  Last pap smear was on 07/2017 and was normal.  No other gynecologic concerns.  Nexplanon Insertion Procedure Patient identified, informed consent performed, consent signed.   Patient does understand that irregular bleeding is a very common side effect of this medication. She was advised to have backup contraception for one week after placement. Pregnancy test in clinic today was negative.  Appropriate time out taken.  Patient's left arm was prepped and draped in the usual sterile fashion. The ruler used to measure and mark insertion area.  Patient was prepped with alcohol swab and then injected with 3 ml of 1% lidocaine.  She was prepped with betadine, Nexplanon removed from packaging,  Device confirmed in needle, then inserted full length of needle and withdrawn per handbook instructions. Nexplanon was able to palpated in the patient's arm; patient palpated the insert herself. There was minimal blood loss.  Patient insertion site covered with guaze and a pressure bandage to reduce any bruising.  The patient tolerated the procedure well and was given post procedure instructions.   Thressa Sheller 11:46 AM 04/02/18  Assessment:    6 weeks postpartum exam. Pap smear not done at today's visit. Last pap 07/2017, NIL   Plan:    1. Contraception: Nexplanon 2. Rx lasix 20mg  PO q day x 4 doses. 3. Follow up in: 1 year or as needed.   4. Patient with CHTN, elevated b/p at 9  weeks and 12 weeks. She is resistant to this diagnosis, and states that she will seek a second opinion on it. Patient was requesting something to help with fluid retention. Offered her HCTZ for BP management and diuresis. She states that she will not take that, and only wants something for the fluid. DW Dr. Debroah LoopArnold, ok for patient to have a few doses of lasix, and then she can FU with PCP about HTN diagnosis  5. HX of GDM, 2 hour GTT

## 2018-04-02 NOTE — Addendum Note (Signed)
Addended by: Lorelle GibbsWILSON, CHIQUITA L on: 04/02/2018 11:58 AM   Modules accepted: Orders

## 2018-04-03 ENCOUNTER — Ambulatory Visit: Payer: BLUE CROSS/BLUE SHIELD | Admitting: Certified Nurse Midwife

## 2018-04-04 LAB — GLUCOSE TOLERANCE, 2 HOURS
GLUCOSE FASTING GTT: 91 mg/dL (ref 65–99)
Glucose, 2 hour: 106 mg/dL (ref 65–139)

## 2018-04-09 ENCOUNTER — Telehealth: Payer: Self-pay | Admitting: General Practice

## 2018-04-09 NOTE — Telephone Encounter (Signed)
Patient called and left message on nurse line stating she got the nexplanon in recently. Patient states she is still having pain and bruising from that. Patient states she was also given something for fluid and she is still having very bad swelling. Patient states she is getting concerned. Called patient, no answer- unable to leave message. Phone just disconnected. Will send mychart message

## 2018-05-28 ENCOUNTER — Encounter: Payer: Self-pay | Admitting: Advanced Practice Midwife

## 2018-05-28 ENCOUNTER — Other Ambulatory Visit: Payer: Self-pay

## 2018-05-28 ENCOUNTER — Ambulatory Visit (INDEPENDENT_AMBULATORY_CARE_PROVIDER_SITE_OTHER): Payer: BLUE CROSS/BLUE SHIELD | Admitting: Advanced Practice Midwife

## 2018-05-28 VITALS — BP 135/75 | HR 85 | Wt >= 6400 oz

## 2018-05-28 DIAGNOSIS — Z3046 Encounter for surveillance of implantable subdermal contraceptive: Secondary | ICD-10-CM

## 2018-05-28 NOTE — Progress Notes (Signed)
GYNECOLOGY OFFICE PROCEDURE NOTE  Teena IraniConstance Dubuc is a 32 y.o. G2P2002 here for Nexplanon removal.  Last pap smear was on 07/26/2017 and was normal.  No other gynecologic concerns.  Patient states that she is having irregular bleeding, headaches and mood swings. She reports that her husband has recently left her and she does not have a need for birth control. Offered patient options to help with bleeding. She refuses at this time and is certain that she wants this removed today. She is planning abstinence for birth control.   Patient also reports that her PCP has told her that she does not have high blood pressure.    Nexplanon Removal Patient identified, informed consent performed, consent signed.   Appropriate time out taken. Nexplanon site identified.  Area prepped in usual sterile fashon. One ml of 1% lidocaine was used to anesthetize the area at the distal end of the implant. A small stab incision was made right beside the implant on the distal portion.  The Nexplanon rod was grasped using hemostats and removed without difficulty.  There was minimal blood loss. There were no complications.  3 ml of 1% lidocaine was injected around the incision for post-procedure analgesia.  Steri-strips were applied over the small incision.  A pressure bandage was applied to reduce any bruising.  The patient tolerated the procedure well and was given post procedure instructions.  Patient is planning to use abstinence for contraception.  Thressa ShellerHeather Hogan 3:19 PM 05/28/18

## 2018-05-30 ENCOUNTER — Encounter: Payer: Self-pay | Admitting: *Deleted

## 2018-10-01 ENCOUNTER — Encounter: Payer: Self-pay | Admitting: *Deleted
